# Patient Record
Sex: Male | Born: 1950
Health system: Southern US, Community
[De-identification: ages and names within clinical notes are randomized; demographics above are authoritative.]

## PROBLEM LIST (undated history)

## (undated) DIAGNOSIS — R55 Syncope and collapse: Secondary | ICD-10-CM

## (undated) DIAGNOSIS — T7840XA Allergy, unspecified, initial encounter: Secondary | ICD-10-CM

## (undated) DIAGNOSIS — E785 Hyperlipidemia, unspecified: Secondary | ICD-10-CM

## (undated) HISTORY — PX: OTHER SURGICAL HISTORY: SHX169

## (undated) HISTORY — PX: POLYPECTOMY: SHX149

## (undated) HISTORY — DX: Allergy, unspecified, initial encounter: T78.40XA

## (undated) HISTORY — DX: Hyperlipidemia, unspecified: E78.5

## (undated) HISTORY — DX: Syncope and collapse: R55

---

## 2002-05-30 ENCOUNTER — Ambulatory Visit (HOSPITAL_COMMUNITY): Admission: RE | Admit: 2002-05-30 | Discharge: 2002-05-30 | Payer: Self-pay | Admitting: Gastroenterology

## 2002-06-03 ENCOUNTER — Encounter: Payer: Self-pay | Admitting: *Deleted

## 2002-06-03 ENCOUNTER — Ambulatory Visit (HOSPITAL_COMMUNITY): Admission: RE | Admit: 2002-06-03 | Discharge: 2002-06-03 | Payer: Self-pay | Admitting: *Deleted

## 2004-12-12 ENCOUNTER — Encounter: Admission: RE | Admit: 2004-12-12 | Discharge: 2004-12-12 | Payer: Self-pay | Admitting: *Deleted

## 2005-12-22 ENCOUNTER — Ambulatory Visit: Payer: Self-pay | Admitting: Internal Medicine

## 2005-12-29 ENCOUNTER — Ambulatory Visit: Payer: Self-pay | Admitting: Internal Medicine

## 2006-01-05 ENCOUNTER — Encounter: Payer: Self-pay | Admitting: Cardiology

## 2006-01-05 ENCOUNTER — Ambulatory Visit: Payer: Self-pay

## 2006-01-30 ENCOUNTER — Ambulatory Visit: Payer: Self-pay | Admitting: Internal Medicine

## 2006-02-10 ENCOUNTER — Ambulatory Visit: Payer: Self-pay | Admitting: Internal Medicine

## 2008-03-07 ENCOUNTER — Telehealth: Payer: Self-pay | Admitting: Internal Medicine

## 2008-08-28 ENCOUNTER — Encounter: Admission: RE | Admit: 2008-08-28 | Discharge: 2008-10-19 | Payer: Self-pay | Admitting: Orthopedic Surgery

## 2009-12-31 ENCOUNTER — Encounter (INDEPENDENT_AMBULATORY_CARE_PROVIDER_SITE_OTHER): Payer: Self-pay | Admitting: *Deleted

## 2010-01-30 ENCOUNTER — Encounter (INDEPENDENT_AMBULATORY_CARE_PROVIDER_SITE_OTHER): Payer: Self-pay | Admitting: *Deleted

## 2010-02-04 ENCOUNTER — Ambulatory Visit: Payer: Self-pay | Admitting: Gastroenterology

## 2010-02-27 ENCOUNTER — Encounter: Payer: Self-pay | Admitting: Internal Medicine

## 2010-03-27 ENCOUNTER — Ambulatory Visit: Payer: Self-pay | Admitting: Gastroenterology

## 2010-03-31 ENCOUNTER — Encounter: Payer: Self-pay | Admitting: Gastroenterology

## 2010-07-09 NOTE — Letter (Signed)
Summary: Prostate Cancer Screening/[facility]  Prostate Cancer Screening/[facility]   Imported By: Lanelle Bal 03/19/2010 09:58:01  _____________________________________________________________________  External Attachment:    Type:   Image     Comment:   External Document

## 2010-07-09 NOTE — Procedures (Signed)
Summary: Colonoscopy  Patient: Mike Hall Note: All result statuses are Final unless otherwise noted.  Tests: (1) Colonoscopy (COL)   COL Colonoscopy           DONE     [facility]     [address]     [place], [place]  [address]           COLONOSCOPY PROCEDURE REPORT     PATIENT:  [name], [name]  MR#:  [id number]     BIRTHDATE:  1950/07/14, 59 yrs. old  GENDER:  male     ENDOSCOPIST:  Judie Petit T. Russella Dar, MD, [facility]     Referred by:  Rudi Heap, M.D.     PROCEDURE DATE:  03/27/2010     PROCEDURE:  Colonoscopy with snare polypectomy     ASA CLASS:  Class I     INDICATIONS:  1) Routine Risk Screening     MEDICATIONS:   Fentanyl 75 mcg IV, Versed 10 mg IV     DESCRIPTION OF PROCEDURE:   After the risks benefits and     alternatives of the procedure were thoroughly explained, informed     consent was obtained.  Digital rectal exam was performed and     revealed no abnormalities.   The LB [id number]-[id number] endoscope     was introduced through the anus and advanced to the cecum, which     was identified by both the appendix and ileocecal valve, without     limitations.  The quality of the prep was good, using MoviPrep.     The instrument was then slowly withdrawn as the colon was fully     examined.     <<PROCEDUREIMAGES>>     FINDINGS:  A sessile polyp was found in the cecum. It was 5 mm in     size. Polyp was snared without cautery. Retrieval was successful.     A normal appearing ileocecal valve, and appendiceal orifice were     identified. The ascending, hepatic flexure, transverse, splenic     flexure, descending, sigmoid colon, and rectum appeared     unremarkable. Retroflexed views in the rectum revealed internal     hemorrhoids, small. The time to cecum =  2.25  minutes. The scope     was then withdrawn (time =  9.25  min) from the patient and the     procedure completed.           COMPLICATIONS:  None           ENDOSCOPIC IMPRESSION:     1) 5 mm sessile polyp in  the cecum     2) Internal hemorrhoids           RECOMMENDATIONS:     1) Await pathology results     2) If the polyp removed today is adenomatous (pre-cancerous),     repeat colonoscopy in 5 years. Otherwise follow colorectal cancer     screening guidelines for "routine risk" patients with colonoscopy     in 10 years.           Venita Lick. Russella Dar, MD, [facility]           n.     eSIGNED:   Venita Lick. Stark at 03/27/2010 11:30 AM           Delphia Grates, [id number]  Note: An exclamation mark (!) indicates a result that was not dispersed into the flowsheet. Document Creation Date: 03/27/2010 11:31 AM _______________________________________________________________________  (1)  Order result status: Final Collection or observation date-time: 03/27/2010 11:26 Requested date-time:  Receipt date-time:  Reported date-time:  Referring Physician:   Ordering Physician: Claudette Head [phone]) Specimen Source:  Source: Launa Grill Order Number: [id number] Lab site:   Appended Document: Colonoscopy     Procedures Next Due Date:    Colonoscopy: 03/2015

## 2010-07-09 NOTE — Letter (Signed)
Summary: Moviprep Instructions  [facility]  [address]   [place], [place] [address]   Phone: [phone]  Fax: [phone]       Mike Hall    1951-01-31    MRN: [id number]        Procedure Day /Date: Wednesday, 03/27/10     Arrival Time: 09:30AM     Procedure Time: 10:30AM     Location of Procedure:                    x   [facility] (4th Floor)   PREPARATION FOR COLONOSCOPY WITH MOVIPREP   Starting 5 days prior to your procedure 03/22/2010  do not eat nuts, seeds, popcorn, corn, beans, peas,  salads, or any raw vegetables.  Do not take any fiber supplements (e.g. Metamucil, Citrucel, and Benefiber).  THE DAY BEFORE YOUR PROCEDURE         DATE: 03-26-10    DAY: Tues.  1.  Drink clear liquids the entire day-NO SOLID FOOD  2.  Do not drink anything colored red or purple.  Avoid juices with pulp.  No orange juice.  3.  Drink at least 64 oz. (8 glasses) of fluid/clear liquids during the day to prevent dehydration and help the prep work efficiently.  CLEAR LIQUIDS INCLUDE: Water Jello Ice Popsicles Tea (sugar ok, no milk/cream) Powdered fruit flavored drinks Coffee (sugar ok, no milk/cream) Gatorade Juice: apple, white grape, white cranberry  Lemonade Clear bullion, consomm, broth Carbonated beverages (any kind) Strained chicken noodle soup Hard Candy                             4.  In the morning, mix first dose of MoviPrep solution:    Empty 1 Pouch A and 1 Pouch B into the disposable container    Add lukewarm drinking water to the top line of the container. Mix to dissolve    Refrigerate (mixed solution should be used within 24 hrs)  5.  Begin drinking the prep at 5:00 p.m. The MoviPrep container is divided by 4 marks.   Every 15 minutes drink the solution down to the next mark (approximately 8 oz) until the full liter is complete.   6.  Follow completed prep with 16 oz of clear liquid of your choice (Nothing red or purple).   Continue to drink clear liquids until bedtime.  7.  Before going to bed, mix second dose of MoviPrep solution:    Empty 1 Pouch A and 1 Pouch B into the disposable container    Add lukewarm drinking water to the top line of the container. Mix to dissolve    Refrigerate  THE DAY OF YOUR PROCEDURE      DATE:  03-27-10  DAY: Wed.  Beginning at 5:00 a.m. (5 hours before procedure):         1. Every 15 minutes, drink the solution down to the next mark (approx 8 oz) until the full liter is complete.  2. Follow completed prep with 16 oz. of clear liquid of your choice.    3. You may drink clear liquids until  8:30 a.m.  (2 HOURS BEFORE PROCEDURE).   MEDICATION INSTRUCTIONS  Unless otherwise instructed, you should take regular prescription medications with a small sip of water   as early as possible the morning of your procedure.          OTHER INSTRUCTIONS  You will  need a responsible adult at least 58 years of age to accompany you and drive you home.   This person must remain in the waiting room during your procedure.  Wear loose fitting clothing that is easily removed.  Leave jewelry and other valuables at home.  However, you may wish to bring a book to read or  an iPod/MP3 player to listen to music as you wait for your procedure to start.  Remove all body piercing jewelry and leave at home.  Total time from sign-in until discharge is approximately 2-3 hours.  You should go home directly after your procedure and rest.  You can resume normal activities the  day after your procedure.  The day of your procedure you should not:   Drive   Make legal decisions   Operate machinery   Drink alcohol   Return to work  You will receive specific instructions about eating, activities and medications before you leave.    The above instructions have been reviewed and explained to me by   _______________________    I fully understand and can verbalize these instructions  _____________________________ Date _________

## 2010-07-09 NOTE — Letter (Signed)
Summary: Previsit letter  [facility]  [address] [place], [place] [address]   Phone: [phone]  Fax: [phone]       12/31/2009 MRN: [id number]  Mike Hall [address] [place], [place]  [address]  Dear [name],  Welcome to the Gastroenterology Division at [facility].    You are scheduled to see a nurse for your pre-procedure visit on 01-31-10 at 8:00A.M. on the 3rd floor at [facility], [address]  We ask that you try to arrive at our office 15 minutes prior to your appointment time to allow for check-in.  Your nurse visit will consist of discussing your medical and surgical history, your immediate family medical history, and your medications.    Please bring a complete list of all your medications or, if you prefer, bring the medication bottles and we will list them.  We will need to be aware of both prescribed and over the counter drugs.  We will need to know exact dosage information as well.  If you are on blood thinners (Coumadin, Plavix, Aggrenox, Ticlid, etc.) please call our office today/prior to your appointment, as we need to consult with your physician about holding your medication.   Please be prepared to read and sign documents such as consent forms, a financial agreement, and acknowledgement forms.  If necessary, and with your consent, a friend or relative is welcome to sit-in on the nurse visit with you.  Please bring your insurance card so that we may make a copy of it.  If your insurance requires a referral to see a specialist, please bring your referral form from your primary care physician.  No co-pay is required for this nurse visit.     If you cannot keep your appointment, please call [phone] to cancel or reschedule prior to your appointment date.  This allows [place] the opportunity to schedule an appointment for another patient in need of care.    Thank you for choosing [facility] for your medical needs.  We  appreciate the opportunity to care for you.  Please visit [place] at our website  to learn more about our practice.                     Sincerely.                                                                                                                   The Gastroenterology Division

## 2010-07-09 NOTE — Miscellaneous (Signed)
Summary: [facility]  Clinical Lists Changes  Medications: Added new medication of MOVIPREP 100 GM  SOLR (PEG-KCL-NACL-NASULF-NA ASC-C) As per prep instructions. - Signed Rx of MOVIPREP 100 GM  SOLR (PEG-KCL-NACL-NASULF-NA ASC-C) As per prep instructions.;  #1 x 0;  Signed;  Entered by: Durwin Glaze RN;  Authorized by: Meryl Dare MD [facility];  Method used: Electronically to [id number]*, [address] [place], [place], [place]  [address], Ph: [phone] or [phone], Fax: [phone] Observations: Added new observation of NKA: T (02/04/2010 15:03)    Prescriptions: MOVIPREP 100 GM  SOLR (PEG-KCL-NACL-NASULF-NA ASC-C) As per prep instructions.  #1 x 0   Entered by:   Durwin Glaze RN   Authorized by:   Meryl Dare MD [facility]   Signed by:   Durwin Glaze RN on 02/04/2010   Method used:   Electronically to        [facility] [phone]* (retail)       [address] [place], [place]  [address]       Ph: [phone] or [phone]       Fax: [phone]   RxID:   [id number]

## 2010-07-09 NOTE — Letter (Signed)
Summary: Patient Notice- Polyp Results  [facility]  [address] [place], [place] [address]   Phone: [phone]  Fax: [phone]        March 31, 2010 MRN: [id number]    Mike Hall [address] [place], [place]  [address]    Dear Mr. Angert,  I am pleased to inform you that the colon polyp(s) removed during your recent colonoscopy was (were) found to be benign (no cancer detected) upon pathologic examination.  I recommend you have a repeat colonoscopy examination in 5 years to look for recurrent polyps, as having colon polyps increases your risk for having recurrent polyps or even colon cancer in the future.  Should you develop new or worsening symptoms of abdominal pain, bowel habit changes or bleeding from the rectum or bowels, please schedule an evaluation with either your primary care physician or with me.  Continue treatment plan as outlined the day of your exam.  Please call us if you are having persistent problems or have questions about your condition that have not been fully answered at this time.  Sincerely,  Meryl Dare MD [facility]  This letter has been electronically signed by your physician.  Appended Document: Patient Notice- Polyp Results letter mailed

## 2010-08-01 ENCOUNTER — Other Ambulatory Visit: Payer: Self-pay | Admitting: Dermatology

## 2010-10-25 NOTE — Op Note (Signed)
   NAME:  Mike Hall, Mike Hall                            ACCOUNT NO.:  [id number]   MEDICAL RECORD NO.:  [id number]                   PATIENT TYPE:  AMB   LOCATION:  ENDO                                 FACILITY:  [facility]   PHYSICIAN:  Georgiana Spinner, M.D.                 DATE OF BIRTH:  December 02, 1950   DATE OF PROCEDURE:  05/30/2002  DATE OF DISCHARGE:                                 OPERATIVE REPORT   PROCEDURE:  Upper endoscopy.   ENDOSCOPIST:  Georgiana Spinner, M.D.   INDICATIONS:  Reflux.  Hemoccult positivity.   ANESTHESIA:  Demerol 50 mg, Versed 6 mg.   DESCRIPTION OF PROCEDURE:  With the patient mildly sedated in the left  lateral decubitus position, the Olympus videoscopic endoscope was inserted  into the mouth, passed under direct vision through the esophagus which  appeared normal into the stomach. The fundus, body, antrum, duodenal bulb,  second portion of the duodenum appeared normal  From this point, the  endoscope was slowly withdrawn taking  circumferential views of duodenal  mucosa until the endoscope was pulled back into the stomach, placed in  retroflexion to view the stomach from below, and we were able to visualize  the esophagus on retroflexed view.  The endoscope was then straightened and  withdrawn taking circumferential views of the remaining gastric and  esophageal mucosa.  The patient's vital signs and pulse oximeter remained  stable.  The patient tolerated the procedure well without apparent  complications.   FINDINGS:  Hiatal hernia.  Otherwise unremarkable exam.   PLAN:  Proceed to colonoscopy.                                               Georgiana Spinner, M.D.    GMO/MEDQ  D:  05/30/2002  T:  05/30/2002  Job:  [id number]

## 2010-10-25 NOTE — Op Note (Signed)
   NAME:  BROADUS, COSTILLA                            ACCOUNT NO.:  [id number]   MEDICAL RECORD NO.:  [id number]                   PATIENT TYPE:  AMB   LOCATION:  ENDO                                 [facility]   PHYSICIAN:  Georgiana Spinner, M.D.                 DATE OF BIRTH:  03-02-1951   DATE OF PROCEDURE:  05/30/2002  DATE OF DISCHARGE:                                 OPERATIVE REPORT   PROCEDURE:  Colonoscopy.   ENDOSCOPIST:  Georgiana Spinner, M.D.   INDICATIONS:  Hemoccult positivity.   ANESTHESIA:  Demerol 10 mg, Versed 2 mg.   DESCRIPTION OF PROCEDURE:  With the patient mildly sedated in the left  lateral decubitus position, the rectal exam was performed which was  unremarkable.  Subsequently, the Olympus videoscopic colonoscope was  inserted in the rectum  and passed under direct vision to the cecum,  identified by Crow's foot of the cecum and the ileocecal valve, both of  which were photographed.  After exploring the cecum, the colonoscope was  slowly withdrawn taking circumferential views of the entire colonic mucosa  stopping only in the rectum which appeared on direct and retroflexed view.  The endoscope was straightened and withdrawn.  The patient's vital signs and  pulse oximeter remained stable.  The patient tolerated the procedure well  without apparent complications.   FINDINGS:  Negative examination.   PLAN:  Ask the patient to follow up with me after CT scan.                                               Georgiana Spinner, M.D.    GMO/MEDQ  D:  05/30/2002  T:  05/30/2002  Job:  [id number]

## 2010-10-25 NOTE — Assessment & Plan Note (Signed)
[facility]                             PRIMARY CARE OFFICE NOTE   SENECA, HOBACK                         MRN:          [id number]  DATE:12/29/2005                            DOB:          1950-12-24    CHIEF COMPLAINT:  New patient practice/routine physical.   HISTORY OF PRESENT ILLNESS:  The patient is a 60 year old white male here to  establish primary care. The patient has been followed previously by Dr. Vernon Prey at [facility]. The patient has been fairly  healthy for most of his life. Denies any major illnesses or  hospitalizations. The patient denies any history of heart disease or type 2  diabetes, high cholesterol or hypertension. The patient did have upper  endoscopy and colonoscopy with Dr. Sabino Gasser secondary to positive  Hemoccult. The patient was found to have a hiatal hernia; otherwise,  unremarkable exam and colonoscopy performed in December 2003 was a negative  examination.   The patient does not take any prescription medications. Takes over-the-  counter FiberCon daily and also Colon Cleanse. He does note some issues with  constipation.   ALLERGIES:  None noted.   SOCIAL HISTORY:  The patient is married for 25 years. The patient has one  daughter. 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   FAMILY HISTORY:  Father deceased at age 64 secondary to coronary artery  disease/myocardial infarction. Mother deceased at age 35 secondary to  complications of ovarian cancer. The patient also had a sister noted to have  nonsmall-cell cancer of the lung.   HABITS:  He does not drink or smoke. He does not use any recreational drugs.   REVIEW OF SYSTEMS:  The patient does not exercise on a regular basis, but  denies any exercise intolerance. The patient states he recently returned  from a trip to the beach and was able to swim and hold his breath without  any difficulty. The  patient denies any heartburn, nausea or vomiting.  Constipation is noted above. No diarrhea. No dysuria, frequency or urgency.  All other systems negative. Please also note that he does not complain of  weak urinary stream or incomplete bladder emptying.   PHYSICAL EXAMINATION:  VITALS:  Height 5 feet 10-1/2 inches, weight 196  pounds, temperature 99, pulse 69, BP 113/73.  GENERAL:  The patient is a very pleasant, well-developed, well-nourished 74-  year-old white male in no apparent distress.  HEENT:  Normocephalic, atraumatic. Pupils are equal and react to light  bilaterally. Extraocular muscles are intact. Patient was anicteric.  Conjunctivae was within normal limits. External auditory canals and tympanic  membranes were clear bilaterally. Hearing was grossly normal. Oropharyngeal  exam was unremarkable.  NECK:  Supple, no adenopathy, carotid bruit or thyromegaly.  RESPIRATORY:  Normal respiratory effort.  CHEST:  Clear to auscultation bilaterally. No rhonchi, rales or wheezing.  CARDIOVASCULAR:  Regular rate and rhythm. No significant murmurs, rubs or  gallops appreciated.  ABDOMEN:  Soft, nontender. Positive bowel sounds, no organomegaly.  GENITORECTAL:  Digital rectal exam of  the prostate was performed. Patient  had normal rectal tone. Prostate gland was mildly enlarged but no firmness  or nodules are noted.  SKIN:  Warm and dry.  NEUROLOGIC:  Cranial nerves II-XII was grossly intact. He was not focal.   LABORATORY DATA:  The patient had routine screening labs with the following  results. CBC showed H&H 13.6 and 40.2, WBC and platelets within normal  limits. Comprehensive metabolic profile notable for normal glucose 94, BUN  14, creatinine 0.9, electrolytes are within normal limits. Total bilirubin  was minimally elevated at 1.3. Liver transaminase unremarkable. Cholesterol  panel:  Total cholesterol 227, HDL 36, triglycerides 61, direct LDL 171.5.  TSH 0.71. PSA is noted to be  0.27.   IMPRESSION:  Routine physical in an otherwise healthy 60 year old white male  with some elevated cholesterol readings.   RECOMMENDATIONS:  The patient would like to avoid using statins if possible,  and we discussed the dietary changes. The patient was agreeable to trying  Zetia, and will be started on 10 mg once a day. His lipids will be rechecked  in approximately one month's time. Did go over a low saturated fat diet.   The patient also had an EKG performed in the office that is a screening and  that showed normal sinus rhythm at 59 beats per minute. He did have Q-waves  in the inferolateral leads notable in lead III, aVF, and also leads V5 and  V6. Unclear at this time whether these Q waves reflect previous heart  injury. This EKG was faxed to Dr. Charlies Constable who reviewed and recommended  followup with 2D echocardiogram due to lack of clinical history. The  likelihood of ischemic injury in the past is unlikely.   FOLLOWUP:  Followup time is approximately five weeks.                                   Barbette Hair. Artist Pais, DO   RDY/MedQ  DD:  12/30/2005  DT:  12/30/2005  Job #:  [id number]

## 2012-10-26 ENCOUNTER — Telehealth: Payer: Self-pay | Admitting: Nurse Practitioner

## 2012-10-26 NOTE — Telephone Encounter (Signed)
error 

## 2012-11-19 ENCOUNTER — Telehealth: Payer: Self-pay | Admitting: Nurse Practitioner

## 2012-11-22 ENCOUNTER — Other Ambulatory Visit: Payer: Self-pay | Admitting: *Deleted

## 2012-11-22 MED ORDER — PRAVASTATIN SODIUM 40 MG PO TABS: 40.0000 mg | ORAL_TABLET | Freq: Every day | ORAL | Status: DC

## 2012-11-22 NOTE — Telephone Encounter (Signed)
Done 11/22/12 

## 2012-12-20 ENCOUNTER — Encounter: Payer: Self-pay | Admitting: Nurse Practitioner

## 2012-12-20 ENCOUNTER — Ambulatory Visit (INDEPENDENT_AMBULATORY_CARE_PROVIDER_SITE_OTHER): Payer: 59 | Admitting: Nurse Practitioner

## 2012-12-20 VITALS — BP 113/75 | HR 69 | Temp 98.2°F | Ht 69.0 in | Wt 189.0 lb

## 2012-12-20 DIAGNOSIS — E785 Hyperlipidemia, unspecified: Secondary | ICD-10-CM

## 2012-12-20 NOTE — Patient Instructions (Signed)

## 2012-12-20 NOTE — Progress Notes (Signed)
  Subjective:    Patient ID: Mike Hall, male    DOB: 09-21-1950, 62 y.o.   MRN: [id number]  Hyperlipidemia This is a chronic problem. The current episode started more than 1 year ago. The problem is uncontrolled. Recent lipid tests were reviewed and are high. There are no known factors aggravating his hyperlipidemia. Pertinent negatives include no focal sensory loss, focal weakness, leg pain, myalgias or shortness of breath. Current antihyperlipidemic treatment includes statins (patient started on pravachol at last visit.). The current treatment provides moderate improvement of lipids. There are no compliance problems.       Review of Systems  Respiratory: Negative for shortness of breath.   Musculoskeletal: Negative for myalgias.  Neurological: Negative for focal weakness.  All other systems reviewed and are negative.       Objective:   Physical Exam  Constitutional: He is oriented to person, place, and time. He appears well-developed and well-nourished.  HENT:  Head: Normocephalic.  Right Ear: External ear normal.  Left Ear: External ear normal.  Nose: Nose normal.  Mouth/Throat: Oropharynx is clear and moist.  Eyes: EOM are normal. Pupils are equal, round, and reactive to light.  Neck: Normal range of motion. Neck supple. No thyromegaly present.  Cardiovascular: Normal rate, regular rhythm, normal heart sounds and intact distal pulses.   No murmur heard. Pulmonary/Chest: Effort normal and breath sounds normal. He has no wheezes. He has no rales.  Abdominal: Soft. Bowel sounds are normal.  Musculoskeletal: Normal range of motion.  Neurological: He is alert and oriented to person, place, and time.  Skin: Skin is warm and dry.  Psychiatric: He has a normal mood and affect. His behavior is normal. Judgment and thought content normal.     BP 113/75  Pulse 69  Temp(Src) 98.2 F (36.8 C) (Oral)  Ht 5\' 9"  (1.753 m)  Wt 189 lb (85.73 kg)  BMI 27.9 kg/m2      Assessment &  Plan:  1. Hyperlipidemia Low fat diet and exercise - COMPLETE METABOLIC PANEL WITH GFR - NMR Lipoprofile with Lipids  Mary-Margaret Daphine Deutscher, FNP

## 2012-12-21 LAB — NMR LIPOPROFILE WITH LIPIDS
Cholesterol, Total: 189 mg/dL (ref <200)
HDL Particle Number: 32.2 umol/L (ref >=30.5)
LDL Size: 20.4 nm — ABNORMAL LOW (ref >20.5)
LP-IR Score: 74 — ABNORMAL HIGH (ref <=45)
Large HDL-P: 2.7 umol/L — ABNORMAL LOW (ref >=4.8)
Large VLDL-P: 2.8 nmol/L — ABNORMAL HIGH (ref <=2.7)
Small LDL Particle Number: 1229 nmol/L — ABNORMAL HIGH (ref <=527)

## 2012-12-21 LAB — COMPLETE METABOLIC PANEL WITH GFR
ALT: 24 U/L (ref 0–53)
AST: 24 U/L (ref 0–37)
Albumin: 4.7 g/dL (ref 3.5–5.2)
CO2: 28 mEq/L (ref 19–32)
Calcium: 9.4 mg/dL (ref 8.4–10.5)
Chloride: 104 mEq/L (ref 96–112)
GFR, Est African American: >89 mL/min
Potassium: 4.6 mEq/L (ref 3.5–5.3)
Sodium: 140 mEq/L (ref 135–145)
Total Protein: 6.8 g/dL (ref 6.0–8.3)

## 2012-12-23 ENCOUNTER — Telehealth: Payer: Self-pay | Admitting: Nurse Practitioner

## 2012-12-23 MED ORDER — ATORVASTATIN CALCIUM 40 MG PO TABS: 40.0000 mg | ORAL_TABLET | Freq: Every day | ORAL | Status: DC

## 2012-12-23 NOTE — Telephone Encounter (Signed)
Mmm, pt is willing to try the lipitor-(generic) that was reccommended. Please send in to [facility] and have nurse call pt when its done.

## 2012-12-30 ENCOUNTER — Telehealth: Payer: Self-pay | Admitting: Nurse Practitioner

## 2012-12-31 NOTE — Telephone Encounter (Signed)
Spoke with patient's wife.

## 2013-02-14 ENCOUNTER — Telehealth: Payer: Self-pay | Admitting: Nurse Practitioner

## 2013-02-14 DIAGNOSIS — E785 Hyperlipidemia, unspecified: Secondary | ICD-10-CM

## 2013-02-15 MED ORDER — ATORVASTATIN CALCIUM 40 MG PO TABS: 40.0000 mg | ORAL_TABLET | Freq: Every day | ORAL | Status: DC

## 2013-02-15 NOTE — Telephone Encounter (Signed)
rx sent to pharmacy

## 2013-02-17 ENCOUNTER — Telehealth: Payer: Self-pay | Admitting: Nurse Practitioner

## 2013-02-17 ENCOUNTER — Other Ambulatory Visit: Payer: Self-pay | Admitting: Nurse Practitioner

## 2013-05-16 ENCOUNTER — Ambulatory Visit (INDEPENDENT_AMBULATORY_CARE_PROVIDER_SITE_OTHER): Payer: 59 | Admitting: Nurse Practitioner

## 2013-05-16 ENCOUNTER — Encounter: Payer: Self-pay | Admitting: Nurse Practitioner

## 2013-05-16 VITALS — BP 127/75 | HR 81 | Temp 97.1°F | Ht 69.5 in | Wt 191.0 lb

## 2013-05-16 DIAGNOSIS — Z0000 Encounter for general adult medical examination without abnormal findings: Secondary | ICD-10-CM

## 2013-05-16 DIAGNOSIS — Z23 Encounter for immunization: Secondary | ICD-10-CM

## 2013-05-16 DIAGNOSIS — E785 Hyperlipidemia, unspecified: Secondary | ICD-10-CM

## 2013-05-16 DIAGNOSIS — Z125 Encounter for screening for malignant neoplasm of prostate: Secondary | ICD-10-CM

## 2013-05-16 LAB — POCT CBC
Granulocyte percent: 57 %G (ref 37–80)
Lymph, poc: 1.4 (ref 0.6–3.4)
MPV: 8.5 fL (ref 0–99.8)
POC LYMPH PERCENT: 34.5 %L (ref 10–50)
Platelet Count, POC: 135 10*3/uL — AB (ref 142–424)
RBC: 4.4 M/uL — AB (ref 4.69–6.13)

## 2013-05-16 MED ORDER — PRAVASTATIN SODIUM 40 MG PO TABS: 40.0000 mg | ORAL_TABLET | Freq: Every day | ORAL | Status: DC

## 2013-05-16 NOTE — Patient Instructions (Signed)
Health Maintenance, Males A healthy lifestyle and preventative care can promote health and wellness.  Maintain regular health, dental, and eye exams.  Eat a healthy diet. Foods like vegetables, fruits, whole grains, low-fat dairy products, and lean protein foods contain the nutrients you need without too many calories. Decrease your intake of foods high in solid fats, added sugars, and salt. Get information about a proper diet from your caregiver, if necessary.  Regular physical exercise is one of the most important things you can do for your health. Most adults should get at least 150 minutes of moderate-intensity exercise (any activity that increases your heart rate and causes you to sweat) each week. In addition, most adults need muscle-strengthening exercises on 2 or more days a week.   Maintain a healthy weight. The body mass index (BMI) is a screening tool to identify possible weight problems. It provides an estimate of body fat based on height and weight. Your caregiver can help determine your BMI, and can help you achieve or maintain a healthy weight. For adults 20 years and older:  A BMI below 18.5 is considered underweight.  A BMI of 18.5 to 24.9 is normal.  A BMI of 25 to 29.9 is considered overweight.  A BMI of 30 and above is considered obese.  Maintain normal blood lipids and cholesterol by exercising and minimizing your intake of saturated fat. Eat a balanced diet with plenty of fruits and vegetables. Blood tests for lipids and cholesterol should begin at age 20 and be repeated every 5 years. If your lipid or cholesterol levels are high, you are over 50, or you are a high risk for heart disease, you may need your cholesterol levels checked more frequently.Ongoing high lipid and cholesterol levels should be treated with medicines, if diet and exercise are not effective.  If you smoke, find out from your caregiver how to quit. If you do not use tobacco, do not start.  Lung  cancer screening is recommended for adults aged 55 80 years who are at high risk for developing lung cancer because of a history of smoking. Yearly low-dose computed tomography (CT) is recommended for people who have at least a 30-pack-year history of smoking and are a current smoker or have quit within the past 15 years. A pack year of smoking is smoking an average of 1 pack of cigarettes a day for 1 year (for example: 1 pack a day for 30 years or 2 packs a day for 15 years). Yearly screening should continue until the smoker has stopped smoking for at least 15 years. Yearly screening should also be stopped for people who develop a health problem that would prevent them from having lung cancer treatment.  If you choose to drink alcohol, do not exceed 2 drinks per day. One drink is considered to be 12 ounces (355 mL) of beer, 5 ounces (148 mL) of wine, or 1.5 ounces (44 mL) of liquor.  Avoid use of street drugs. Do not share needles with anyone. Ask for help if you need support or instructions about stopping the use of drugs.  High blood pressure causes heart disease and increases the risk of stroke. Blood pressure should be checked at least every 1 to 2 years. Ongoing high blood pressure should be treated with medicines if weight loss and exercise are not effective.  If you are 45 to 60 years old, ask your caregiver if you should take aspirin to prevent heart disease.  Diabetes screening involves taking a blood   sample to check your fasting blood sugar level. This should be done once every 3 years, after age 45, if you are within normal weight and without risk factors for diabetes. Testing should be considered at a younger age or be carried out more frequently if you are overweight and have at least 1 risk factor for diabetes.  Colorectal cancer can be detected and often prevented. Most routine colorectal cancer screening begins at the age of 50 and continues through age 75. However, your caregiver may  recommend screening at an earlier age if you have risk factors for colon cancer. On a yearly basis, your caregiver may provide home test kits to check for hidden blood in the stool. Use of a small camera at the end of a tube, to directly examine the colon (sigmoidoscopy or colonoscopy), can detect the earliest forms of colorectal cancer. Talk to your caregiver about this at age 50, when routine screening begins. Direct examination of the colon should be repeated every 5 to 10 years through age 75, unless early forms of pre-cancerous polyps or small growths are found.  Hepatitis C blood testing is recommended for all people born from 1945 through 1965 and any individual with known risks for hepatitis C.  Healthy men should no longer receive prostate-specific antigen (PSA) blood tests as part of routine cancer screening. Consult with your caregiver about prostate cancer screening.  Testicular cancer screening is not recommended for adolescents or adult males who have no symptoms. Screening includes self-exam, caregiver exam, and other screening tests. Consult with your caregiver about any symptoms you have or any concerns you have about testicular cancer.  Practice safe sex. Use condoms and avoid high-risk sexual practices to reduce the spread of sexually transmitted infections (STIs).  Use sunscreen. Apply sunscreen liberally and repeatedly throughout the day. You should seek shade when your shadow is shorter than you. Protect yourself by wearing long sleeves, pants, a wide-brimmed hat, and sunglasses year round, whenever you are outdoors.  Notify your caregiver of new moles or changes in moles, especially if there is a change in shape or color. Also notify your caregiver if a mole is larger than the size of a pencil eraser.  A one-time screening for abdominal aortic aneurysm (AAA) and surgical repair of large AAAs by sound wave imaging (ultrasonography) is recommended for ages 65 to 75 years who are  current or former smokers.  Stay current with your immunizations. Document Released: 11/22/2007 Document Revised: 09/20/2012 Document Reviewed: 10/21/2010 ExitCare Patient Information 2014 [employer].  

## 2013-05-16 NOTE — Progress Notes (Signed)
  Subjective:    Patient ID: Mike Hall, male    DOB: [birth year], 62 y.o.   MRN: [id number]    Patient here today for CPE and follow up of hyperlipidemia- He is doing well without complaints  Hyperlipidemia This is a chronic problem. The current episode started more than 1 year ago. The problem is uncontrolled. Recent lipid tests were reviewed and are high. There are no known factors aggravating his hyperlipidemia. Pertinent negatives include no focal sensory loss, focal weakness, leg pain, myalgias or shortness of breath. Current antihyperlipidemic treatment includes statins (patient started on pravachol at last visit.). The current treatment provides moderate improvement of lipids. There are no compliance problems.       Review of Systems  Respiratory: Negative for shortness of breath.   Musculoskeletal: Negative for myalgias.  Neurological: Negative for focal weakness.  All other systems reviewed and are negative.       Objective:   Physical Exam  Constitutional: He is oriented to person, place, and time. He appears well-developed and well-nourished.  HENT:  Head: Normocephalic.  Right Ear: External ear normal.  Left Ear: External ear normal.  Nose: Nose normal.  Mouth/Throat: Oropharynx is clear and moist.  Eyes: EOM are normal. Pupils are equal, round, and reactive to light.  Neck: Normal range of motion. Neck supple. No thyromegaly present.  Cardiovascular: Normal rate, regular rhythm, normal heart sounds and intact distal pulses.   No murmur heard. Pulmonary/Chest: Effort normal and breath sounds normal. He has no wheezes. He has no rales.  Abdominal: Soft. Bowel sounds are normal.  Genitourinary:  Refuses prostate exam   Musculoskeletal: Normal range of motion.  Neurological: He is alert and oriented to person, place, and time.  Skin: Skin is warm and dry.  Psychiatric: He has a normal mood and affect. His behavior is normal. Judgment and thought content normal.      BP 127/75  Pulse 81  Temp(Src) 97.1 F (36.2 C) (Oral)  Ht 5' 9.5" (1.765 m)  Wt 191 lb (86.637 kg)  BMI 27.81 kg/m2       Assessment & Plan:   1. Hyperlipidemia LDL goal < 100   2. Annual physical exam   3. Prostate cancer screening    Orders Placed This Encounter  Procedures  . CMP14+EGFR  . NMR, lipoprofile  . PSA, total and free  . POCT CBC   Meds ordered this encounter  Medications  . DISCONTD: pravastatin (PRAVACHOL) 40 MG tablet    Sig: Take 40 mg by mouth daily.  . magnesium gluconate (MAGONATE) 500 MG tablet    Sig: Take 500 mg by mouth 2 (two) times daily.  . pravastatin (PRAVACHOL) 40 MG tablet    Sig: Take 1 tablet (40 mg total) by mouth daily.    Dispense:  90 tablet    Refill:  1    Order Specific Question:  Supervising Provider    Answer:  Deborra Medina    Continue all meds Labs pending Diet and exercise encouraged Health maintenance reviewed Follow up in 6 months Hemoccult cards given  Mike Daphine Deutscher, FNP

## 2013-05-16 NOTE — Addendum Note (Signed)
Addended by: Bernita Buffy on: 05/16/2013 03:34 PM   Modules accepted: Orders

## 2013-05-17 LAB — NMR, LIPOPROFILE
HDL Cholesterol by NMR: 37 mg/dL — ABNORMAL LOW (ref >=40)
HDL Particle Number: 25.6 umol/L — ABNORMAL LOW (ref >=30.5)
LDL Size: 20.8 nm (ref >20.5)
Small LDL Particle Number: 843 nmol/L — ABNORMAL HIGH (ref <=527)
Triglycerides by NMR: 278 mg/dL — ABNORMAL HIGH (ref <150)

## 2013-05-17 LAB — CMP14+EGFR
AST: 50 IU/L — ABNORMAL HIGH (ref 0–40)
Albumin/Globulin Ratio: 1.9 (ref 1.1–2.5)
BUN/Creatinine Ratio: 20 (ref 10–22)
Calcium: 9.1 mg/dL (ref 8.6–10.2)
Creatinine, Ser: 0.81 mg/dL (ref 0.76–1.27)
GFR calc Af Amer: 112 mL/min/{1.73_m2} (ref >59)
GFR calc non Af Amer: 97 mL/min/{1.73_m2} (ref >59)
Globulin, Total: 2.4 g/dL (ref 1.5–4.5)
Sodium: 140 mmol/L (ref 134–144)
Total Bilirubin: 0.2 mg/dL (ref 0.0–1.2)

## 2013-05-17 LAB — PSA, TOTAL AND FREE: PSA: 0.3 ng/mL (ref 0.0–4.0)

## 2013-05-20 ENCOUNTER — Other Ambulatory Visit (INDEPENDENT_AMBULATORY_CARE_PROVIDER_SITE_OTHER): Payer: 59

## 2013-05-20 DIAGNOSIS — Z1212 Encounter for screening for malignant neoplasm of rectum: Secondary | ICD-10-CM

## 2013-05-20 NOTE — Progress Notes (Signed)
Pt dropped off FOBT only 

## 2013-05-22 LAB — FECAL OCCULT BLOOD, IMMUNOCHEMICAL: Fecal Occult Bld: NEGATIVE

## 2014-09-18 ENCOUNTER — Ambulatory Visit (INDEPENDENT_AMBULATORY_CARE_PROVIDER_SITE_OTHER): Payer: 59 | Admitting: Family Medicine

## 2014-09-18 ENCOUNTER — Encounter: Payer: Self-pay | Admitting: Family Medicine

## 2014-09-18 VITALS — BP 122/80 | HR 76 | Temp 98.1°F | Wt 190.0 lb

## 2014-09-18 DIAGNOSIS — Z86010 Personal history of colonic polyps: Secondary | ICD-10-CM

## 2014-09-18 DIAGNOSIS — Z0000 Encounter for general adult medical examination without abnormal findings: Secondary | ICD-10-CM

## 2014-09-18 DIAGNOSIS — E785 Hyperlipidemia, unspecified: Secondary | ICD-10-CM

## 2014-09-18 NOTE — Patient Instructions (Signed)
They will call you in October for your next colonoscopy  Get fasting labs as listed. If they cannot provide them all or some at work, come by here for fasting labs and we will obtain.   My main concern is your cholesterol and seeing if you need a cholesterol medication  HIV testing is a 1x screening test per national recommendations for anyone age 62-65

## 2014-09-18 NOTE — Assessment & Plan Note (Signed)
On recall list for 03/2015 for colonoscopy given history adenoma

## 2014-09-18 NOTE — Assessment & Plan Note (Signed)
S: previously on pravasatatin, exercise level is lacking but diet is reasonable A/P: encouraged starting exercise and continue healthy eating. Check lipids through work and if these cannot all be processed he will follow up with [place] for labs.

## 2014-09-18 NOTE — Progress Notes (Signed)
Mike Reddish, MD Phone: [phone]  Subjective:  Patient presents today to establish care as new patient and for annual physical. Chief complaint-noted.   See problem oriented charting with additions: -Former patient at [facility]. Wife is patient here and they wanted to be in the same practice. He has questions about his next colonoscopy. He is indoors a lot and wants to know his vitamin D level. He is interested in prostate cancer screening.  ROS- no rectal bleeding or melenano chest pain or shortness of breath. No myalgias  The following were reviewed and entered/updated in epic: Past Medical History  Diagnosis Date  . Hyperlipidemia    Patient Active Problem List   Diagnosis Date Noted  . History of adenomatous polyp of colon 09/18/2014  . Hyperlipidemia 12/20/2012   Past Surgical History  Procedure Laterality Date  . None      Family History  Problem Relation Age of Onset  . Uterine cancer Mother     8  . Coronary artery disease Father     age 62  . Diabetes Mellitus I Daughter   . Hypertension Brother     Medications- reviewed and updated Current Outpatient Prescriptions  Medication Sig Dispense Refill  . magnesium gluconate (MAGONATE) 500 MG tablet Take 500 mg by mouth 2 (two) times daily.     Allergies-reviewed and updated No Known Allergies  History   Social History  . Marital Status: Married    Spouse Name: N/A  . Number of Children: N/A  . Years of Education: N/A   Social History Main Topics  . Smoking status: Never Smoker   . Smokeless tobacco: Not on file  . Alcohol Use: No  . Drug Use: No  . Sexual Activity: Not on file   Other Topics Concern  . None   Social History Narrative   Married. 1 daughter. No grandkids.    GED      Works in [employer] in [place]. 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       Hobbies: time at home, yardwork    ROS--See HPI , otherwise full ROS was completed and negative except as noted  above  Objective: BP 122/80 mmHg  Pulse 76  Temp(Src) 98.1 F (36.7 C)  Wt 190 lb (86.183 kg) Gen: NAD, resting comfortably HEENT: Mucous membranes are moist. Oropharynx normal. TM obscured by cerumen Eyes: sclera and lids normal, PERRLA Neck: no thyromegaly, no cervical lymphadenopathy CV: RRR no murmurs rubs or gallops Lungs: CTAB no crackles, wheeze, rhonchi Abdomen: soft/nontender/nondistended/normal bowel sounds. No rebound or guarding.  Rectal: normal tone, normal prostate, no masses or tenderness Ext: no edema, 2+ PT pulses, full range of motion of fingers Skin: warm, dry Neuro: 5/5 strength in upper and lower extremities, normal gait, normal reflexes   Assessment/Plan:  64 y.o. male presenting for annual physical.  Health Maintenance counseling: 1. Anticipatory guidance: Patient counseled regarding regular dental exams, wearing seatbelts, wearing sunscreen. Sees Dermatologist once a year.  2. Risk factor reduction:  Advised patient of need for regular exercise (walking currently 2-3x a week) and diet rich and fruits and vegetables to reduce risk of heart attack and stroke.  3. Immunizations/screenings/ancillary studies-advised need for HIV  History of adenomatous polyp of colon On recall list for 03/2015 for colonoscopy given history adenoma   Hyperlipidemia S: previously on pravasatatin, exercise level is lacking but diet is reasonable A/P: encouraged starting exercise and continue healthy eating. Check lipids through work and if these cannot all be processed he will  follow up with [place] for labs.     Jammed L middle finger S: sore for about a month, slowly improving, primarily hurts with extension in the PIP joint A/P: improving, we discussed considering SM referral if does nto continue to improve. Think x-ray likely low yield and doubt fracture  1 year follow up at least for CPE  Advised the following labs which he may get completed through work, he will fax [place] a  copy. He will get the remaining labs at our office.  Orders Placed This Encounter  Procedures  . CBC    [facility]    Standing Status: Future     Number of Occurrences:      Standing Expiration Date: 09/18/2015  . Comprehensive metabolic panel    [facility]    Standing Status: Future     Number of Occurrences:      Standing Expiration Date: 09/18/2015    Order Specific Question:  Has the patient fasted?    Answer:  No  . Lipid panel    [facility]    Standing Status: Future     Number of Occurrences:      Standing Expiration Date: 09/18/2015    Order Specific Question:  Has the patient fasted?    Answer:  No  . TSH    [facility]    Standing Status: Future     Number of Occurrences:      Standing Expiration Date: 09/18/2015  . PSA    Standing Status: Future     Number of Occurrences:      Standing Expiration Date: 09/18/2015  . Vit D  25 hydroxy (rtn osteoporosis monitoring)    [facility]    Standing Status: Future     Number of Occurrences:      Standing Expiration Date: 09/18/2015  . HIV antibody    [facility]    Standing Status: Future     Number of Occurrences:      Standing Expiration Date: 09/18/2015  . POCT urinalysis dipstick    Standing Status: Future     Number of Occurrences:      Standing Expiration Date: 09/18/2015

## 2014-09-26 LAB — CBC AND DIFFERENTIAL
HCT: 40 % — AB (ref 41–53)
Hemoglobin: 13.4 g/dL — AB (ref 13.5–17.5)
NEUTROS ABS: 2 /uL
Platelets: 188 10*3/uL (ref 150–399)
WBC: 4.6 10^3/mL

## 2014-09-26 LAB — BASIC METABOLIC PANEL
BUN: 13 mg/dL (ref 4–21)
Creatinine: 1 mg/dL (ref <=1.3)
Glucose: 101 mg/dL
Potassium: 4.8 mmol/L (ref 3.4–5.3)
Sodium: 140 mmol/L (ref 137–147)

## 2014-09-26 LAB — HEPATIC FUNCTION PANEL
ALK PHOS: 57 U/L (ref 25–125)
ALT: 18 U/L (ref 10–40)
AST: 24 U/L (ref 14–40)
Bilirubin, Total: 0.6 mg/dL

## 2014-09-26 LAB — LIPID PANEL
Cholesterol: 216 mg/dL — AB (ref 0–200)
HDL: 47 mg/dL (ref 35–70)
LDL Cholesterol: 150 mg/dL
Triglycerides: 96 mg/dL (ref 40–160)

## 2014-09-26 LAB — TSH: TSH: 1.35 u[IU]/mL (ref <=5.90)

## 2014-09-26 LAB — PSA: PSA: 0.4

## 2014-10-17 ENCOUNTER — Encounter: Payer: Self-pay | Admitting: Family Medicine

## 2014-10-17 ENCOUNTER — Telehealth: Payer: Self-pay | Admitting: Family Medicine

## 2014-10-17 NOTE — Telephone Encounter (Signed)
Pt mailed in lab results and would like to confirm that dr hunter received these results. Then would like to know what dr hunter thinks about these labs.

## 2014-10-17 NOTE — Telephone Encounter (Signed)
Bevelyn Ngo thanks for abstracting them when they arrived.   Concerns 1. Blood sugar 101 places him at risk for diabetes. We had discussed exercise and continuing healthy eating which I still think is most important thing here.  2. Cholesterol total 216 above goal 200 and bad cholesterol above goal 100 at 150. 10 year cardiac risk calculates out at 9.6%. With his father's history I think it would be reasonable to restart pravastatin 40mg  #90 daily. Alternatively, he can work on the exercise and we can reassess in a year. I think above 12% risk I would push more heavily for a  Statin. Some current guidelines recommend starting above 7.5%.  3. They did not check HIV or vitamin D which I had ordered. He can certainly come by for just these 2 tests if desired. You can cancel other tests Bevelyn Ngo if that is possible. Or just make sure if he comes in to tell lab that he only needs these 2.   Normal Kidney, liver, electrolytes look great Thyrodi normal Blood counts normal PSA is normal/low range. Trend is more important than 1 number so will continue to trend.

## 2014-10-17 NOTE — Telephone Encounter (Signed)
Thanks, havent gotten anything yet, will get them to Dr. Yong Channel once received.

## 2014-10-18 NOTE — Telephone Encounter (Signed)
Pt notified and stated he will work on his exercise and diet and recheck in a year and he does not wish to have vit D or HIV labs done at this time.

## 2014-10-24 ENCOUNTER — Encounter: Payer: Self-pay | Admitting: Family Medicine

## 2015-01-30 ENCOUNTER — Encounter: Payer: Self-pay | Admitting: Gastroenterology

## 2015-02-02 ENCOUNTER — Encounter: Payer: Self-pay | Admitting: Gastroenterology

## 2015-02-22 ENCOUNTER — Ambulatory Visit (INDEPENDENT_AMBULATORY_CARE_PROVIDER_SITE_OTHER): Payer: 59 | Admitting: Pediatrics

## 2015-02-22 ENCOUNTER — Encounter: Payer: Self-pay | Admitting: Pediatrics

## 2015-02-22 VITALS — BP 126/84 | HR 64 | Temp 97.1°F | Ht 69.5 in | Wt 190.0 lb

## 2015-02-22 DIAGNOSIS — H6123 Impacted cerumen, bilateral: Secondary | ICD-10-CM

## 2015-02-22 NOTE — Patient Instructions (Signed)
Use debrox drops as needed for wax build up.

## 2015-02-22 NOTE — Progress Notes (Signed)
    Subjective:    Patient ID: Mike Hall, male    DOB: 22-Aug-1950, 64 y.o.   MRN: [id number]  HPI: Mike Hall is a 64 y.o. male presenting on 02/22/2015 for Cerumen Impaction   About a week ago started having a stopped up nose. Feels like he has had a little bit of congestion, now feels like he has pressure in his head. No fevers.   Has had ears stopped up in past with wax and needed them cleaned out. Trying OTC ear drops to soften the wax at home with no improvement in symptoms. He has been swimming recently, no pain in ears just pressures.  Relevant past medical, surgical, family and social history reviewed and updated as indicated. Interim medical history since our last visit reviewed. Allergies and medications reviewed and updated.   ROS: Per HPI unless specifically indicated above  Past Medical History Patient Active Problem List   Diagnosis Date Noted  . History of adenomatous polyp of colon 09/18/2014  . Hyperlipidemia 12/20/2012    Current Outpatient Prescriptions  Medication Sig Dispense Refill  . magnesium gluconate (MAGONATE) 500 MG tablet Take 500 mg by mouth 2 (two) times daily.     No current facility-administered medications for this visit.       Objective:    BP 126/84 mmHg  Pulse 64  Temp(Src) 97.1 F (36.2 C) (Oral)  Ht 5' 9.5" (1.765 m)  Wt 190 lb (86.183 kg)  BMI 27.67 kg/m2  Wt Readings from Last 3 Encounters:  02/22/15 190 lb (86.183 kg)  09/18/14 190 lb (86.183 kg)  05/16/13 191 lb (86.637 kg)    Gen: NAD, alert, cooperative with exam, NCAT EYES: EOMI, no scleral injection or icterus ENT:  TMs with impacted dark brown wax, cerumen also present in canals b/l, visible canal not red. OP without erythema LYMPH: no cervical LAD CV: warm, well-perfused Resp:  normal WOB Neuro: Alert and oriented, strength equal b/l UE and LE, coordination grossly normal MSK: normal muscle bulk     Assessment & Plan:   Mike Hall was seen today for cerumen  impaction.  Diagnoses and all orders for this visit:  Cerumen impaction, bilateral Symptoms much improved after removal of cerumen. Normal exam after procedure. See procedure note below. Use debrox drops OTC as needed for future build up of cerumen.    PROCEDURE NOTE: Impacted cerumen removal: After procedure described to patient and patient agreed with proceeding, a cerumen impaction removal squirt bottle system. Using isopropyl alcohol mixed with and water, cerumen impaction was removed from both ears. TMs pearly gray following procedure, no cerumen in canals b/l. Pt tolerated procedure well.    Follow up plan: Return if symptoms worsen or fail to improve.    Assunta Found, MD [facility] 02/22/2015, 5:32 PM

## 2015-04-04 ENCOUNTER — Ambulatory Visit (AMBULATORY_SURGERY_CENTER): Payer: Self-pay

## 2015-04-04 VITALS — Ht 70.5 in | Wt 189.2 lb

## 2015-04-04 DIAGNOSIS — Z860100 Personal history of colon polyps, unspecified: Secondary | ICD-10-CM

## 2015-04-04 MED ORDER — SUPREP BOWEL PREP KIT 17.5-3.13-1.6 GM/177ML PO SOLN: 1.0000 | Freq: Once | ORAL | Status: DC

## 2015-04-04 NOTE — Progress Notes (Signed)
No allergies to eggs or soy No diet/weight loss meds No past problem with anesthesia ; has had colonoscopy before No home oxygen  Has internet; refused emmi

## 2015-04-10 ENCOUNTER — Telehealth: Payer: Self-pay | Admitting: Gastroenterology

## 2015-04-10 NOTE — Telephone Encounter (Signed)
Instructed pt's wife to come to [facility] and get sample of suprep. Instructions remain the same . Instructed to pick up prep 4th floor desk. She states that they will pick up the prep by Friday   Lelan Pons PV

## 2015-04-18 ENCOUNTER — Ambulatory Visit (AMBULATORY_SURGERY_CENTER): Payer: 59 | Admitting: Gastroenterology

## 2015-04-18 ENCOUNTER — Encounter: Payer: Self-pay | Admitting: Gastroenterology

## 2015-04-18 VITALS — BP 128/94 | HR 58 | Temp 95.9°F | Resp 21 | Ht 70.0 in | Wt 189.0 lb

## 2015-04-18 DIAGNOSIS — D122 Benign neoplasm of ascending colon: Secondary | ICD-10-CM

## 2015-04-18 DIAGNOSIS — Z86010 Personal history of colonic polyps: Secondary | ICD-10-CM | POA: Diagnosis not present

## 2015-04-18 MED ORDER — SODIUM CHLORIDE 0.9 % IV SOLN: 500.0000 mL | INTRAVENOUS | Status: DC

## 2015-04-18 NOTE — Op Note (Signed)
[facility]  [address] [place], [address]   COLONOSCOPY PROCEDURE REPORT  PATIENT: Mike Hall, Mike Hall  MR#: [id number] BIRTHDATE: 28-Jul-1950 , 17  yrs. old GENDER: male ENDOSCOPIST: Ladene Artist, MD, [facility] PROCEDURE DATE:  04/18/2015 PROCEDURE:   Colonoscopy, surveillance and Colonoscopy with snare polypectomy First Screening Colonoscopy - Avg.  risk and is 50 yrs.  old or older - No.  Prior Negative Screening - Now for repeat screening. N/A  History of Adenoma - Now for follow-up colonoscopy & has been > or = to 3 yrs.  Yes hx of adenoma.  Has been 3 or more years since last colonoscopy.  Polyps removed today? Yes ASA CLASS:   Class II INDICATIONS:Surveillance due to prior colonic neoplasia and PH Colon Adenoma. MEDICATIONS: Monitored anesthesia care and Propofol 200 mg IV DESCRIPTION OF PROCEDURE:   After the risks benefits and alternatives of the procedure were thoroughly explained, informed consent was obtained.  The digital rectal exam revealed no abnormalities of the rectum.   The LB [id number]  endoscope was introduced through the anus and advanced to the cecum, which was identified by both the appendix and ileocecal valve. No adverse events experienced.   The quality of the prep was excellent. (Suprep was used)  The instrument was then slowly withdrawn as the colon was fully examined. Estimated blood loss is zero unless otherwise noted in this procedure report.    COLON FINDINGS: A sessile polyp measuring 5 mm in size was found in the ascending colon.  A polypectomy was performed with a cold snare.  The resection was complete, the polyp tissue was completely retrieved and sent to histology.   The examination was otherwise normal.  Retroflexed views revealed internal Grade I hemorrhoids. The time to cecum = 1.8 Withdrawal time = 10.7   The scope was withdrawn and the procedure completed. COMPLICATIONS: There were no immediate  complications.  ENDOSCOPIC IMPRESSION: 1.   Sessile polyp in the ascending colon; polypectomy performed with a cold snare 2.   Grade l internal hemorrhoids  RECOMMENDATIONS: 1.  Await pathology results 2.  Repeat Colonoscopy in 5 years.  eSigned:  Ladene Artist, MD, [facility] 04/18/2015 9:36 AM

## 2015-04-18 NOTE — Progress Notes (Signed)
Called to room to assist during endoscopic procedure.  Patient ID and intended procedure confirmed with present staff. Received instructions for my participation in the procedure from the performing physician.  

## 2015-04-18 NOTE — Patient Instructions (Signed)
Colon polyp removed today and hemorrhoids seen. Handouts given on polyps and hemorrhoids. Resume current medications. Repeat colonoscopy in 5 years. Call us with any questions or concerns. Thank you!  YOU HAD AN ENDOSCOPIC PROCEDURE TODAY AT [facility]:   Refer to the procedure report that was given to you for any specific questions about what was found during the examination.  If the procedure report does not answer your questions, please call your gastroenterologist to clarify.  If you requested that your care partner not be given the details of your procedure findings, then the procedure report has been included in a sealed envelope for you to review at your convenience later.  YOU SHOULD EXPECT: Some feelings of bloating in the abdomen. Passage of more gas than usual.  Walking can help get rid of the air that was put into your GI tract during the procedure and reduce the bloating. If you had a lower endoscopy (such as a colonoscopy or flexible sigmoidoscopy) you may notice spotting of blood in your stool or on the toilet paper. If you underwent a bowel prep for your procedure, you may not have a normal bowel movement for a few days.  Please Note:  You might notice some irritation and congestion in your nose or some drainage.  This is from the oxygen used during your procedure.  There is no need for concern and it should clear up in a day or so.  SYMPTOMS TO REPORT IMMEDIATELY:   Following lower endoscopy (colonoscopy or flexible sigmoidoscopy):  Excessive amounts of blood in the stool  Significant tenderness or worsening of abdominal pains  Swelling of the abdomen that is new, acute  Fever of 100F or higher   For urgent or emergent issues, a gastroenterologist can be reached at any hour by calling [phone].   DIET: Your first meal following the procedure should be a small meal and then it is ok to progress to your normal diet. Heavy or fried foods are harder to  digest and may make you feel nauseous or bloated.  Likewise, meals heavy in dairy and vegetables can increase bloating.  Drink plenty of fluids but you should avoid alcoholic beverages for 24 hours.  ACTIVITY:  You should plan to take it easy for the rest of today and you should NOT DRIVE or use heavy machinery until tomorrow (because of the sedation medicines used during the test).    FOLLOW UP: Our staff will call the number listed on your records the next business day following your procedure to check on you and address any questions or concerns that you may have regarding the information given to you following your procedure. If we do not reach you, we will leave a message.  However, if you are feeling well and you are not experiencing any problems, there is no need to return our call.  We will assume that you have returned to your regular daily activities without incident.  If any biopsies were taken you will be contacted by phone or by letter within the next 1-3 weeks.  Please call us at [phone] if you have not heard about the biopsies in 3 weeks.    SIGNATURES/CONFIDENTIALITY: You and/or your care partner have signed paperwork which will be entered into your electronic medical record.  These signatures attest to the fact that that the information above on your After Visit Summary has been reviewed and is understood.  Full responsibility of the confidentiality of this discharge information  lies with you and/or your care-partner.

## 2015-04-18 NOTE — Progress Notes (Signed)
Report to PACU, RN, vss, BBS= Clear.  

## 2015-04-19 ENCOUNTER — Telehealth: Payer: Self-pay | Admitting: Emergency Medicine

## 2015-04-19 NOTE — Telephone Encounter (Signed)
  Follow up Call-  Call back number 04/18/2015  Post procedure Call Back phone  # [phone] cell  Permission to leave phone message Yes     Patient questions:  Do you have a fever, pain , or abdominal swelling? No. Pain Score  0 *  Have you tolerated food without any problems? Yes.    Have you been able to return to your normal activities? Yes.    Do you have any questions about your discharge instructions: Diet   No. Medications  No. Follow up visit  No.  Do you have questions or concerns about your Care? No.  Actions: * If pain score is 4 or above: No action needed, pain <4.

## 2015-04-26 ENCOUNTER — Encounter: Payer: Self-pay | Admitting: Gastroenterology

## 2015-08-16 ENCOUNTER — Encounter: Payer: Self-pay | Admitting: Family Medicine

## 2015-09-04 ENCOUNTER — Encounter: Payer: Self-pay | Admitting: Gastroenterology

## 2015-09-18 ENCOUNTER — Other Ambulatory Visit (INDEPENDENT_AMBULATORY_CARE_PROVIDER_SITE_OTHER): Payer: 59

## 2015-09-18 DIAGNOSIS — Z0000 Encounter for general adult medical examination without abnormal findings: Secondary | ICD-10-CM

## 2015-09-18 LAB — COMPREHENSIVE METABOLIC PANEL
ALBUMIN: 4.4 g/dL (ref 3.5–5.2)
ALT: 17 U/L (ref 0–53)
AST: 20 U/L (ref 0–37)
Alkaline Phosphatase: 55 U/L (ref 39–117)
BILIRUBIN TOTAL: 0.8 mg/dL (ref 0.2–1.2)
BUN: 17 mg/dL (ref 6–23)
CALCIUM: 9.4 mg/dL (ref 8.4–10.5)
CO2: 30 mEq/L (ref 19–32)
CREATININE: 0.92 mg/dL (ref 0.40–1.50)
Chloride: 104 mEq/L (ref 96–112)
GFR: 88.39 mL/min (ref >60.00)
Glucose, Bld: 100 mg/dL — ABNORMAL HIGH (ref 70–99)
Potassium: 4.6 mEq/L (ref 3.5–5.1)
SODIUM: 139 meq/L (ref 135–145)
TOTAL PROTEIN: 6.8 g/dL (ref 6.0–8.3)

## 2015-09-18 LAB — POCT URINALYSIS DIPSTICK
Bilirubin, UA: NEGATIVE
Blood, UA: NEGATIVE
Glucose, UA: NEGATIVE
KETONES UA: NEGATIVE
Leukocytes, UA: NEGATIVE
Nitrite, UA: NEGATIVE
PH UA: 7.5
PROTEIN UA: NEGATIVE
SPEC GRAV UA: 1.015
Urobilinogen, UA: 0.2

## 2015-09-18 LAB — CBC
HCT: 37.8 % — ABNORMAL LOW (ref 39.0–52.0)
Hemoglobin: 12.9 g/dL — ABNORMAL LOW (ref 13.0–17.0)
MCHC: 34.2 g/dL (ref 30.0–36.0)
MCV: 88.1 fl (ref 78.0–100.0)
PLATELETS: 176 10*3/uL (ref 150.0–400.0)
RBC: 4.29 Mil/uL (ref 4.22–5.81)
RDW: 13.2 % (ref 11.5–15.5)
WBC: 4.8 10*3/uL (ref 4.0–10.5)

## 2015-09-18 LAB — LIPID PANEL
CHOLESTEROL: 194 mg/dL (ref 0–200)
HDL: 42.2 mg/dL (ref >39.00)
LDL CALC: 135 mg/dL — AB (ref 0–99)
NonHDL: 151.45
TRIGLYCERIDES: 81 mg/dL (ref 0.0–149.0)
Total CHOL/HDL Ratio: 5
VLDL: 16.2 mg/dL (ref 0.0–40.0)

## 2015-09-18 LAB — VITAMIN D 25 HYDROXY (VIT D DEFICIENCY, FRACTURES): VITD: 24.32 ng/mL — ABNORMAL LOW (ref 30.00–100.00)

## 2015-09-18 LAB — HIV ANTIBODY (ROUTINE TESTING W REFLEX): HIV 1&2 Ab, 4th Generation: NONREACTIVE

## 2015-09-18 LAB — PSA: PSA: 0.34 ng/mL (ref 0.10–4.00)

## 2015-09-18 LAB — TSH: TSH: 1.29 u[IU]/mL (ref 0.35–4.50)

## 2015-09-25 ENCOUNTER — Ambulatory Visit (INDEPENDENT_AMBULATORY_CARE_PROVIDER_SITE_OTHER): Payer: 59 | Admitting: Family Medicine

## 2015-09-25 ENCOUNTER — Encounter: Payer: Self-pay | Admitting: Family Medicine

## 2015-09-25 VITALS — BP 110/70 | HR 72 | Temp 98.5°F | Ht 69.5 in | Wt 188.0 lb

## 2015-09-25 DIAGNOSIS — E785 Hyperlipidemia, unspecified: Secondary | ICD-10-CM

## 2015-09-25 DIAGNOSIS — K5900 Constipation, unspecified: Secondary | ICD-10-CM | POA: Diagnosis not present

## 2015-09-25 DIAGNOSIS — Z0000 Encounter for general adult medical examination without abnormal findings: Secondary | ICD-10-CM

## 2015-09-25 MED ORDER — VITAMIN D (ERGOCALCIFEROL) 1.25 MG (50000 UNIT) PO CAPS: 50000.0000 [IU] | ORAL_CAPSULE | ORAL | Status: DC

## 2015-09-25 NOTE — Progress Notes (Addendum)
Mike Reddish, MD Phone: [phone]  Subjective:  Patient presents today for their annual physical. Chief complaint-noted.   See problem oriented charting- ROS- full  review of systems was completed and negative including No chest pain or shortness of breath. No headache or blurry vision.   The following were reviewed and entered/updated in epic: Past Medical History  Diagnosis Date  . Hyperlipidemia   . Pre-syncope    Patient Active Problem List   Diagnosis Date Noted  . Hyperlipidemia 12/20/2012    Priority: Medium  . History of adenomatous polyp of colon 09/18/2014    Priority: Low  . Constipation 09/25/2015   Past Surgical History  Procedure Laterality Date  . None      Family History  Problem Relation Age of Onset  . Uterine cancer Mother     15  . Coronary artery disease Father     age 74  . Diabetes Mellitus I Daughter   . Hypertension Brother   . Colon cancer Neg Hx     Medications- reviewed and updated Current Outpatient Prescriptions  Medication Sig Dispense Refill  . Flaxseed, Linseed, (FLAXSEED OIL PO) Take 1,000 mg by mouth. 3450 mg    . Magnesium Gluconate (MAGONATE PO) Take 800 mg by mouth.     No current facility-administered medications for this visit.    Allergies-reviewed and updated No Known Allergies  Social History   Social History  . Marital Status: Married    Spouse Name: N/A  . Number of Children: N/A  . Years of Education: N/A   Social History Main Topics  . Smoking status: Never Smoker   . Smokeless tobacco: None  . Alcohol Use: No  . Drug Use: No  . Sexual Activity: Not Asked   Other Topics Concern  . None   Social History Narrative   Married. 1 daughter. No grandkids.    GED      Works in [employer] in [place]. 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       Hobbies: time at home, yardwork    ROS--See HPI   Objective: BP 110/70 mmHg  Pulse 72  Temp(Src) 98.5 F (36.9 C)  Ht 5' 9.5" (1.765 m)  Wt 188  lb (85.276 kg)  BMI 27.37 kg/m2 Gen: NAD, resting comfortably HEENT: Mucous membranes are moist. Oropharynx normal Neck: no thyromegaly CV: RRR no murmurs rubs or gallops Lungs: CTAB no crackles, wheeze, rhonchi Abdomen: soft/nontender/nondistended/normal bowel sounds. No rebound or guarding.  Rectal: normal tone, normal size prostate, no masses or tenderness  Ext: no edema Skin: warm, dry Neuro: grossly normal, moves all extremities, PERRLA  Assessment/Plan:  65 y.o. male presenting for annual physical.  Health Maintenance counseling: 1. Anticipatory guidance: Patient counseled regarding regular dental exams, eye exams, wearing seatbelts.  2. Risk factor reduction:  Advised patient of need for regular exercise and diet rich and fruits and vegetables to reduce risk of heart attack and stroke. Last year was walking 2-3x a week- remains the same today- discussed bumping this up due to sugar level. Diet largely unchanged, stable weight.  Wt Readings from Last 3 Encounters:  09/25/15 188 lb (85.276 kg)  04/18/15 189 lb (85.73 kg)  04/04/15 189 lb 3.2 oz (85.821 kg)  3. Immunizations/screenings/ancillary studies Health Maintenance Due  Topic Date Due  . Hepatitis C Screening -next labs 24-Jul-1950   4. Prostate cancer screening- low risk based off psa and rectal   Lab Results  Component Value Date   PSA 0.34 09/18/2015  PSA 0.4 09/26/2014   PSA 0.3 05/16/2013   5. Colon cancer screening - 04/18/15 adenoma x1 with plan for 5 year repeat.  6. Skin cancer screening- sees dermatology once a year, wearing sunscreen but all the time- doing better.   Hyperlipidemia S: mild poor control on no statin with LDL 135. No myalgias.  Lab Results  Component Value Date   CHOL 194 09/18/2015   HDL 42.20 09/18/2015   LDLCALC 135* 09/18/2015   TRIG 81.0 09/18/2015   CHOLHDL 5 09/18/2015   A/P: 10 year ascvd risk is 8.6%. Declined statin until 10%.   Constipation 25 years of issues. Has been  taking magnesium hydroxide 400mg  twice a day for years, recently over last year increased to 800 mg and allows him to go every other year. We are going to trial miralax 1 capful a day as I have more experience with long term use on miralax  also treat vitamin D with 50k units for 8 weeks Return in about 1 year (around 09/24/2016) for physical. Return precautions advised.   Meds ordered this encounter  Medications  . Magnesium Gluconate (MAGONATE PO)    Sig: Take 800 mg by mouth.  . Vitamin D, Ergocalciferol, (DRISDOL) 50000 units CAPS capsule    Sig: Take 1 capsule (50,000 Units total) by mouth every 7 (seven) days.    Dispense:  8 capsule    Refill:  0

## 2015-09-25 NOTE — Assessment & Plan Note (Signed)
25 years of issues. Has been taking magnesium hydroxide 400mg  twice a day for years, recently over last year increased to 800 mg and allows him to go every other year. We are going to trial miralax 1 capful a day as I have more experience with long term use on miralax

## 2015-09-25 NOTE — Patient Instructions (Addendum)
We are going to trial miralax 1 capful a day as I have more experience with long term use on miralax May change back to magnesium if not effective. Try to drink at least 60 oz of water a day as well  No other changes today- when 10 year risk of heart attack or stroke gets over 10% we will restart cholesterol medicine

## 2015-09-25 NOTE — Assessment & Plan Note (Signed)
S: mild poor control on no statin with LDL 135. No myalgias.  Lab Results  Component Value Date   CHOL 194 09/18/2015   HDL 42.20 09/18/2015   LDLCALC 135* 09/18/2015   TRIG 81.0 09/18/2015   CHOLHDL 5 09/18/2015   A/P: 10 year ascvd risk is 8.6%. Declined statin until 10%.

## 2015-09-25 NOTE — Addendum Note (Signed)
Addended by: Marin Olp on: 09/25/2015 02:29 PM   Modules accepted: Orders

## 2015-11-20 ENCOUNTER — Other Ambulatory Visit: Payer: Self-pay | Admitting: Family Medicine

## 2016-09-04 ENCOUNTER — Ambulatory Visit (INDEPENDENT_AMBULATORY_CARE_PROVIDER_SITE_OTHER): Payer: 59 | Admitting: Family Medicine

## 2016-09-04 ENCOUNTER — Encounter: Payer: Self-pay | Admitting: Family Medicine

## 2016-09-04 VITALS — BP 126/82 | HR 69 | Temp 97.5°F | Ht 69.5 in | Wt 186.6 lb

## 2016-09-04 DIAGNOSIS — R0982 Postnasal drip: Secondary | ICD-10-CM

## 2016-09-04 DIAGNOSIS — J301 Allergic rhinitis due to pollen: Secondary | ICD-10-CM | POA: Diagnosis not present

## 2016-09-04 MED ORDER — GUAIFENESIN-CODEINE 100-10 MG/5ML PO SOLN: 5.0000 mL | Freq: Three times a day (TID) | ORAL | 0 refills | Status: DC | PRN

## 2016-09-04 NOTE — Patient Instructions (Signed)
Great to meet you!  Start a daily plain zyrtec ( generic is good, not Zyrtec D), also start flonase 2 sprays per nostril once daily.

## 2016-09-04 NOTE — Progress Notes (Signed)
   HPI  Patient presents today here with cough.  Patient explains that he's had nasal congestion, cough, and light frontal headache for 3-4 days.  Cough is centered around nighttime mostly. He does have frequent throat clearing during the daytime.  Patient denies any shortness of breath, fevers, chills. He's been going to work like usual without problem.  He has tried Mucinex without improvement.  PMH: Smoking status noted ROS: Per HPI  Objective: BP 126/82   Pulse 69   Temp 97.5 F (36.4 C) (Oral)   Ht 5' 9.5" (1.765 m)   Wt 186 lb 9.6 oz (84.6 kg)   BMI 27.16 kg/m  Gen: NAD, alert, cooperative with exam HEENT: NCAT, nares with boggy, sense swollen turbinates especially on the right side, TMs normal bilaterally, oropharynx clear and moist CV: RRR, good S1/S2, no murmur Resp: CTABL, no wheezes, non-labored Ext: No edema, warm Neuro: Alert and oriented, No gross deficits  Assessment and plan:  # Acute allergic rhinitis, postnasal drip Symptoms most consistent with postnasal drip Start Zyrtec plus Flonase No signs of serious bacterial infection Nighttime cough-given codeine obtaining cough syrup, discussed short course only and not to drive after taking it. Return to clinic with any concerns or failure to improve as expected.    Meds ordered this encounter  Medications  . guaiFENesin-codeine 100-10 MG/5ML syrup    Sig: Take 5-10 mLs by mouth 3 (three) times daily as needed for cough.    Dispense:  120 mL    Refill:  Trosky, MD [facility] 09/04/2016, 2:58 PM

## 2016-09-18 ENCOUNTER — Other Ambulatory Visit: Payer: Self-pay

## 2016-09-18 DIAGNOSIS — Z125 Encounter for screening for malignant neoplasm of prostate: Secondary | ICD-10-CM

## 2016-09-18 DIAGNOSIS — Z0000 Encounter for general adult medical examination without abnormal findings: Secondary | ICD-10-CM

## 2016-09-18 DIAGNOSIS — Z1159 Encounter for screening for other viral diseases: Secondary | ICD-10-CM

## 2016-09-18 DIAGNOSIS — E785 Hyperlipidemia, unspecified: Secondary | ICD-10-CM

## 2016-09-19 ENCOUNTER — Other Ambulatory Visit (INDEPENDENT_AMBULATORY_CARE_PROVIDER_SITE_OTHER): Payer: 59

## 2016-09-19 DIAGNOSIS — Z125 Encounter for screening for malignant neoplasm of prostate: Secondary | ICD-10-CM

## 2016-09-19 DIAGNOSIS — E785 Hyperlipidemia, unspecified: Secondary | ICD-10-CM | POA: Diagnosis not present

## 2016-09-19 DIAGNOSIS — Z1159 Encounter for screening for other viral diseases: Secondary | ICD-10-CM

## 2016-09-19 DIAGNOSIS — Z0000 Encounter for general adult medical examination without abnormal findings: Secondary | ICD-10-CM | POA: Diagnosis not present

## 2016-09-19 LAB — CBC
HCT: 39.4 % (ref 39.0–52.0)
Hemoglobin: 13.6 g/dL (ref 13.0–17.0)
MCHC: 34.6 g/dL (ref 30.0–36.0)
MCV: 87.8 fl (ref 78.0–100.0)
Platelets: 197 10*3/uL (ref 150.0–400.0)
RBC: 4.49 Mil/uL (ref 4.22–5.81)
RDW: 13 % (ref 11.5–15.5)
WBC: 4.9 10*3/uL (ref 4.0–10.5)

## 2016-09-19 LAB — COMPREHENSIVE METABOLIC PANEL
ALT: 21 U/L (ref 0–53)
AST: 21 U/L (ref 0–37)
Albumin: 4.4 g/dL (ref 3.5–5.2)
Alkaline Phosphatase: 56 U/L (ref 39–117)
BILIRUBIN TOTAL: 0.7 mg/dL (ref 0.2–1.2)
BUN: 18 mg/dL (ref 6–23)
CHLORIDE: 103 meq/L (ref 96–112)
CO2: 31 meq/L (ref 19–32)
CREATININE: 0.95 mg/dL (ref 0.40–1.50)
Calcium: 9.6 mg/dL (ref 8.4–10.5)
GFR: 84.9 mL/min (ref >60.00)
GLUCOSE: 102 mg/dL — AB (ref 70–99)
Potassium: 4.7 mEq/L (ref 3.5–5.1)
Sodium: 139 mEq/L (ref 135–145)
Total Protein: 6.8 g/dL (ref 6.0–8.3)

## 2016-09-19 LAB — LIPID PANEL
CHOL/HDL RATIO: 5
Cholesterol: 221 mg/dL — ABNORMAL HIGH (ref 0–200)
HDL: 45.8 mg/dL (ref >39.00)
LDL CALC: 162 mg/dL — AB (ref 0–99)
NONHDL: 175.66
TRIGLYCERIDES: 69 mg/dL (ref 0.0–149.0)
VLDL: 13.8 mg/dL (ref 0.0–40.0)

## 2016-09-19 LAB — PSA: PSA: 0.35 ng/mL (ref 0.10–4.00)

## 2016-09-20 LAB — HEPATITIS C ANTIBODY: HCV AB: NEGATIVE

## 2016-09-26 ENCOUNTER — Ambulatory Visit (INDEPENDENT_AMBULATORY_CARE_PROVIDER_SITE_OTHER): Payer: 59 | Admitting: Family Medicine

## 2016-09-26 ENCOUNTER — Encounter: Payer: Self-pay | Admitting: Family Medicine

## 2016-09-26 VITALS — BP 106/80 | HR 65 | Temp 97.9°F | Ht 70.0 in | Wt 186.6 lb

## 2016-09-26 DIAGNOSIS — E7800 Pure hypercholesterolemia, unspecified: Secondary | ICD-10-CM

## 2016-09-26 DIAGNOSIS — Z0000 Encounter for general adult medical examination without abnormal findings: Secondary | ICD-10-CM

## 2016-09-26 NOTE — Patient Instructions (Addendum)
Let us know by January if you want to do shingrix. Can do a nurse visit for both shots- 2-3 months apart  Need to get exercise up to 150 minutes a week. Try to eat a healthier diet- see tips below  ______________________________________________________________________  Starting October 1st 2018, I will be transferring to our new location: [facility] (corner of [address] [place] [employer]) [place], [place] Phone: [phone]  I would love to have you remain my patient at this new location as long as it remains convenient for you. I am excited about the opportunity to have x-ray and sports medicine in the new building but will really miss the awesome staff and physicians at [facility]. Continue to schedule appointments at [facility] and we will automatically transfer them to the [place] location starting October 1st.   ____________________________________________________________________    Mediterranean Diet A Mediterranean diet refers to food and lifestyle choices that are based on the traditions of countries located on the [employer]. This way of eating has been shown to help prevent certain conditions and improve outcomes for people who have chronic diseases, like kidney disease and heart disease. What are tips for following this plan? Lifestyle   Cook and eat meals together with your family, when possible.  Drink enough fluid to keep your urine clear or pale yellow.  Be physically active every day. This includes:  Aerobic exercise like running or swimming.  Leisure activities like gardening, walking, or housework.  Get 7-8 hours of sleep each night.  If recommended by your health care provider, drink red wine in moderation. This means 1 glass a day for nonpregnant women and 2 glasses a day for men. A glass of wine equals 5 oz (150 mL). Reading food labels   Check the  serving size of packaged foods. For foods such as rice and pasta, the serving size refers to the amount of cooked product, not dry.  Check the total fat in packaged foods. Avoid foods that have saturated fat or trans fats.  Check the ingredients list for added sugars, such as corn syrup. Shopping   At the grocery store, buy most of your food from the areas near the walls of the store. This includes:  Fresh fruits and vegetables (produce).  Grains, beans, nuts, and seeds. Some of these may be available in unpackaged forms or large amounts (in bulk).  Fresh seafood.  Poultry and eggs.  Low-fat dairy products.  Buy whole ingredients instead of prepackaged foods.  Buy fresh fruits and vegetables in-season from local farmers markets.  Buy frozen fruits and vegetables in resealable bags.  If you do not have access to quality fresh seafood, buy precooked frozen shrimp or canned fish, such as tuna, salmon, or sardines.  Buy small amounts of raw or cooked vegetables, salads, or olives from the deli or salad bar at your store.  Stock your pantry so you always have certain foods on hand, such as olive oil, canned tuna, canned tomatoes, rice, pasta, and beans. Cooking   Cook foods with extra-virgin olive oil instead of using butter or other vegetable oils.  Have meat as a side dish, and have vegetables or grains as your main dish. This means having meat in small portions or adding small amounts of meat to foods like pasta or stew.  Use beans or vegetables instead of meat in common dishes like chili or lasagna.  Experiment  with different cooking methods. Try roasting or broiling vegetables instead of steaming or sauteing them.  Add frozen vegetables to soups, stews, pasta, or rice.  Add nuts or seeds for added healthy fat at each meal. You can add these to yogurt, salads, or vegetable dishes.  Marinate fish or vegetables using olive oil, lemon juice, garlic, and fresh herbs. Meal  planning   Plan to eat 1 vegetarian meal one day each week. Try to work up to 2 vegetarian meals, if possible.  Eat seafood 2 or more times a week.  Have healthy snacks readily available, such as:  Vegetable sticks with hummus.  Greek yogurt.  Fruit and nut trail mix.  Eat balanced meals throughout the week. This includes:  Fruit: 2-3 servings a day  Vegetables: 4-5 servings a day  Low-fat dairy: 2 servings a day  Fish, poultry, or lean meat: 1 serving a day  Beans and legumes: 2 or more servings a week  Nuts and seeds: 1-2 servings a day  Whole grains: 6-8 servings a day  Extra-virgin olive oil: 3-4 servings a day  Limit red meat and sweets to only a few servings a month What are my food choices?  Mediterranean diet  Recommended  Grains: Whole-grain pasta. Brown rice. Bulgar wheat. Polenta. Couscous. Whole-wheat bread. [place].  Vegetables: Artichokes. Beets. Broccoli. Cabbage. Carrots. Eggplant. Green beans. Chard. Kale. Spinach. Onions. Leeks. Peas. Squash. Tomatoes. Peppers. Radishes.  Fruits: Apples. Apricots. Avocado. Berries. Bananas. Cherries. Dates. Figs. Grapes. Lemons. Melon. Oranges. Peaches. Plums. Pomegranate.  Meats and other protein foods: Beans. Almonds. Sunflower seeds. Pine nuts. Peanuts. [place]. Salmon. Scallops. Shrimp. [place]. Tilapia. Clams. Oysters. Eggs.  Dairy: Low-fat milk. Cheese. Greek yogurt.  Beverages: Water. Red wine. Herbal tea.  Fats and oils: Extra virgin olive oil. Avocado oil. Grape seed oil.  Sweets and desserts: [place] yogurt with honey. Baked apples. Poached pears. Trail mix.  Seasoning and other foods: Basil. Cilantro. Coriander. Cumin. Mint. Parsley. Sage. Rosemary. Tarragon. Garlic. Oregano. Thyme. Pepper. Balsalmic vinegar. Tahini. Hummus. Tomato sauce. Olives. Mushrooms.  Limit these  Grains: Prepackaged pasta or rice dishes. Prepackaged cereal with added sugar.  Vegetables: Deep fried potatoes (french  fries).  Fruits: Fruit canned in syrup.  Meats and other protein foods: Beef. Pork. Lamb. Poultry with skin. Hot dogs. Berniece Salines.  Dairy: Ice cream. Sour cream. Whole milk.  Beverages: Juice. Sugar-sweetened soft drinks. Beer. Liquor and spirits.  Fats and oils: Butter. Canola oil. Vegetable oil. Beef fat (tallow). Lard.  Sweets and desserts: Cookies. Cakes. Pies. Candy.  Seasoning and other foods: Mayonnaise. Premade sauces and marinades.  The items listed may not be a complete list. Talk with your dietitian about what dietary choices are right for you. Summary  The Mediterranean diet includes both food and lifestyle choices.  Eat a variety of fresh fruits and vegetables, beans, nuts, seeds, and whole grains.  Limit the amount of red meat and sweets that you eat.  Talk with your health care provider about whether it is safe for you to drink red wine in moderation. This means 1 glass a day for nonpregnant women and 2 glasses a day for men. A glass of wine equals 5 oz (150 mL). This information is not intended to replace advice given to you by your health care provider. Make sure you discuss any questions you have with your health care provider. Document Released: 01/17/2016 Document Revised: 02/19/2016 Document Reviewed: 01/17/2016 Elsevier Interactive Patient Education  2017 [employer].

## 2016-09-26 NOTE — Assessment & Plan Note (Signed)
S: poorly controlled on no rx. No myalgias.  Lab Results  Component Value Date   CHOL 221 (H) 09/19/2016   HDL 45.80 09/19/2016   LDLCALC 162 (H) 09/19/2016   TRIG 69.0 09/19/2016   CHOLHDL 5 09/19/2016   A/P: 10 year risk 9.1%- slowly climbing- we agreed 10% cut off still

## 2016-09-26 NOTE — Progress Notes (Signed)
Phone: [phone]  Subjective:  Patient presents today for their annual physical. Chief complaint-noted.   See problem oriented charting- ROS- full  review of systems was completed and negative including No chest pain or shortness of breath. No headache or blurry vision. BM every other day  The following were reviewed and entered/updated in epic: Past Medical History:  Diagnosis Date  . Hyperlipidemia   . Pre-syncope    Patient Active Problem List   Diagnosis Date Noted  . Hyperlipidemia 12/20/2012    Priority: Medium  . History of adenomatous polyp of colon 09/18/2014    Priority: Low  . Constipation 09/25/2015   Past Surgical History:  Procedure Laterality Date  . none      Family History  Problem Relation Age of Onset  . Uterine cancer Mother     30  . Coronary artery disease Father     age 33  . Diabetes Mellitus I Daughter   . Hypertension Brother   . Colon cancer Neg Hx     Medications- reviewed and updated Current Outpatient Prescriptions  Medication Sig Dispense Refill  . cetirizine (ZYRTEC) 10 MG tablet Take 10 mg by mouth daily.    . Flaxseed, Linseed, (FLAXSEED OIL PO) Take 1,000 mg by mouth. 3450 mg    . fluticasone (FLONASE) 50 MCG/ACT nasal spray Place 2 sprays into both nostrils daily.    . Magnesium Gluconate (MAGONATE PO) Take 1,200 mg by mouth.      No current facility-administered medications for this visit.     Allergies-reviewed and updated No Known Allergies  Social History   Social History  . Marital status: Married    Spouse name: N/A  . Number of children: N/A  . Years of education: N/A   Social History Main Topics  . Smoking status: Never Smoker  . Smokeless tobacco: Never Used  . Alcohol use No  . Drug use: No  . Sexual activity: Not Asked   Other Topics Concern  . None   Social History Narrative   Married. 1 daughter. No grandkids.    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       Hobbies: time at home, yardwork    Objective: BP 106/80 (BP Location: Left Arm, Patient Position: Sitting, Cuff Size: Large)   Pulse 65   Temp 97.9 F (36.6 C) (Oral)   Ht 5\' 10"  (1.778 m)   Wt 186 lb 9.6 oz (84.6 kg)   SpO2 94%   BMI 26.77 kg/m  Gen: NAD, resting comfortably HEENT: Mucous membranes are moist. Oropharynx normal Neck: no thyromegaly CV: RRR no murmurs rubs or gallops Lungs: CTAB no crackles, wheeze, rhonchi Abdomen: soft/nontender/nondistended/normal bowel sounds. No rebound or guarding.  Ext: no edema Skin: warm, dry Neuro: grossly normal, moves all extremities, PERRLA Rectal: normal tone, normal sized prostate, no masses or tenderness Hemorrhoid tags noted  Assessment/Plan:  66 y.o. male presenting for annual physical.  Health Maintenance counseling: 1. Anticipatory guidance: Patient counseled regarding regular dental exams q6 months, eye exams -every 2-3 years, wearing seatbelts.  2. Risk factor reduction:  Advised patient of need for regular exercise and diet rich and fruits and vegetables to reduce risk of heart attack and stroke. Exercise- walking 2x a week for 2 miles- advised 150 minutes a week. Diet-needs to cut sweets down some- discussed reducing.  Wt Readings from Last 3 Encounters:  09/26/16 186 lb 9.6 oz (84.6 kg)  09/04/16 186  lb 9.6 oz (84.6 kg)  09/25/15 188 lb (85.3 kg)  3. Immunizations/screenings/ancillary studies- will start shingrix x2 today. Return 2-3 months Immunization History  Administered Date(s) Administered  . Influenza Split 03/03/2013  . Influenza,inj,Quad PF,36+ Mos 02/28/2014, 03/08/2015, 02/26/2016  . Zoster 05/16/2013   4. Prostate cancer screening- very low risk psa trend and rectal exam  Lab Results  Component Value Date   PSA 0.35 09/19/2016   PSA 0.34 09/18/2015   PSA 0.4 09/26/2014   5. Colon cancer screening - 04/2015  With 5 year follow up 6. Skin cancer screening- sees derm once a year. Wears  sunscreen regularly.   Status of chronic or acute concerns   Seasonal allergies on zyrtec and flonase  Hyperlipidemia S: poorly controlled on no rx. No myalgias.  Lab Results  Component Value Date   CHOL 221 (H) 09/19/2016   HDL 45.80 09/19/2016   LDLCALC 162 (H) 09/19/2016   TRIG 69.0 09/19/2016   CHOLHDL 5 09/19/2016   A/P: 10 year risk 9.1%- slowly climbing- we agreed 10% cut off still  Will add vitamin D next year.   1 year CPE  Meds ordered this encounter  Medications  . fluticasone (FLONASE) 50 MCG/ACT nasal spray    Sig: Place 2 sprays into both nostrils daily.  . cetirizine (ZYRTEC) 10 MG tablet    Sig: Take 10 mg by mouth daily.    Return precautions advised.   Garret Reddish, MD

## 2016-09-26 NOTE — Progress Notes (Signed)
Pre visit review using our clinic review tool, if applicable. No additional management support is needed unless otherwise documented below in the visit note. 

## 2017-05-04 ENCOUNTER — Encounter: Payer: Self-pay | Admitting: Family Medicine

## 2017-05-04 ENCOUNTER — Ambulatory Visit: Payer: 59 | Admitting: Family Medicine

## 2017-05-04 VITALS — BP 139/89 | HR 76 | Temp 98.3°F | Ht 70.0 in | Wt 186.0 lb

## 2017-05-04 DIAGNOSIS — J0100 Acute maxillary sinusitis, unspecified: Secondary | ICD-10-CM | POA: Diagnosis not present

## 2017-05-04 DIAGNOSIS — H6123 Impacted cerumen, bilateral: Secondary | ICD-10-CM | POA: Diagnosis not present

## 2017-05-04 DIAGNOSIS — J00 Acute nasopharyngitis [common cold]: Secondary | ICD-10-CM

## 2017-05-04 MED ORDER — FLUTICASONE PROPIONATE 50 MCG/ACT NA SUSP: 2.0000 | Freq: Every day | NASAL | 1 refills | Status: DC

## 2017-05-04 MED ORDER — BENZONATATE 100 MG PO CAPS: 100.0000 mg | ORAL_CAPSULE | Freq: Three times a day (TID) | ORAL | 0 refills | Status: DC | PRN

## 2017-05-04 NOTE — Patient Instructions (Signed)
This appears to be allergic.  Continue the Zyrtec every night at bedtime.  I prescribed the Flonase nasal spray to use 2 sprays in each nostril once daily.  As we discussed, sometimes this can take a few days before he start noticing full effect.  I also sent in [facility] for use as needed for coughing.  If he develop any signs or symptoms of infection, including fevers or purulence from your nose, please seek reevaluation as he may need an antibiotic.  Allergic Rhinitis Allergic rhinitis is when the mucous membranes in the nose respond to allergens. Allergens are particles in the air that cause your body to have an allergic reaction. This causes you to release allergic antibodies. Through a chain of events, these eventually cause you to release histamine into the blood stream. Although meant to protect the body, it is this release of histamine that causes your discomfort, such as frequent sneezing, congestion, and an itchy, runny nose. What are the causes? Seasonal allergic rhinitis (hay fever) is caused by pollen allergens that may come from grasses, trees, and weeds. Year-round allergic rhinitis (perennial allergic rhinitis) is caused by allergens such as house dust mites, pet dander, and mold spores. What are the signs or symptoms?  Nasal stuffiness (congestion).  Itchy, runny nose with sneezing and tearing of the eyes. How is this diagnosed? Your health care provider can help you determine the allergen or allergens that trigger your symptoms. If you and your health care provider are unable to determine the allergen, skin or blood testing may be used. Your health care provider will diagnose your condition after taking your health history and performing a physical exam. Your health care provider may assess you for other related conditions, such as asthma, pink eye, or an ear infection. How is this treated? Allergic rhinitis does not have a cure, but it can be controlled by:  Medicines that  block allergy symptoms. These may include allergy shots, nasal sprays, and oral antihistamines.  Avoiding the allergen.  Hay fever may often be treated with antihistamines in pill or nasal spray forms. Antihistamines block the effects of histamine. There are over-the-counter medicines that may help with nasal congestion and swelling around the eyes. Check with your health care provider before taking or giving this medicine. If avoiding the allergen or the medicine prescribed do not work, there are many new medicines your health care provider can prescribe. Stronger medicine may be used if initial measures are ineffective. Desensitizing injections can be used if medicine and avoidance does not work. Desensitization is when a patient is given ongoing shots until the body becomes less sensitive to the allergen. Make sure you follow up with your health care provider if problems continue. Follow these instructions at home: It is not possible to completely avoid allergens, but you can reduce your symptoms by taking steps to limit your exposure to them. It helps to know exactly what you are allergic to so that you can avoid your specific triggers. Contact a health care provider if:  You have a fever.  You develop a cough that does not stop easily (persistent).  You have shortness of breath.  You start wheezing.  Symptoms interfere with normal daily activities. This information is not intended to replace advice given to you by your health care provider. Make sure you discuss any questions you have with your health care provider. Document Released: 02/18/2001 Document Revised: 01/25/2016 Document Reviewed: 01/31/2013 Elsevier Interactive Patient Education  2017 [employer].

## 2017-05-04 NOTE — Progress Notes (Signed)
Subjective: CC: rhinorrhea PCP: Marin Olp, MD HPI:Mike Hall is a 66 y.o. male presenting to clinic today for:  1. Rhinorrhea Patient reports onset of sinus pressure, rhinorrhea and sinus drainage about 3 weeks ago.  He notes that he initially had a sore throat but this resolved after starting Zyrtec.  He continues to have postnasal drip and now has a sinus headache that started a few days ago.  He notes nasal congestion.  Denies nasal purulence, fevers, sick contacts, recent travel, nausea, vomiting, diarrhea, chest pain, shortness of breath.  He does report a mild nonproductive cough.  He notes similar symptoms in the spring which were relieved by Zyrtec, Flonase and a cough medication.  No Known Allergies Past Medical History:  Diagnosis Date  . Hyperlipidemia   . Pre-syncope    Family History  Problem Relation Age of Onset  . Uterine cancer Mother        70  . Coronary artery disease Father        age 79  . Diabetes Mellitus I Daughter   . Hypertension Brother   . Colon cancer Neg Hx     Current Outpatient Medications:  .  cetirizine (ZYRTEC) 10 MG tablet, Take 10 mg by mouth daily., Disp: , Rfl:  .  Flaxseed, Linseed, (FLAXSEED OIL PO), Take 1,000 mg by mouth. 3450 mg, Disp: , Rfl:  .  Magnesium Gluconate (MAGONATE PO), Take 1,200 mg by mouth. , Disp: , Rfl:   Social Hx: non smoker.  ROS: Per HPI  Objective: Office vital signs reviewed. BP 139/89   Pulse 76   Temp 98.3 F (36.8 C) (Oral)   Ht 5\' 10"  (1.778 m)   Wt 186 lb (84.4 kg)   BMI 26.69 kg/m   Physical Examination:  General: Awake, alert, well nourished, well appearing male, No acute distress HEENT: Normal    Neck: No masses palpated. No lymphadenopathy    Ears: Bilaterally TM initially obscured by cerumen.  Post irrigation, Right Tympanic membranes intact, normal light reflex, no erythema, no bulging; Left TM continues to be obscured by cerumen.    Eyes: PERRLA, extraocular membranes  intact, sclera white    Nose: nasal turbinates moist, clear nasal discharge; small hemostatic hemorrhage appreciated on the medial aspect of the right nare.     Throat: moist mucus membranes, cobblestoning of the oropharynx appreciated. no tonsillar exudate.  Airway is patent Cardio: regular rate and rhythm, S1S2 heard, no murmurs appreciated Pulm: clear to auscultation bilaterally, no wheezes, rhonchi or rales; normal work of breathing on room air  Assessment/ Plan: 66 y.o. male   1. Acute non-recurrent maxillary sinusitis Findings support allergic sinusitis/rhinitis.  Continue Zyrtec daily.  I have added Flonase to use daily.  I recommended he modification, saline rinses, pushing oral clear fluids.  If he develops any signs or symptoms of infection, I did recommend that he follow-up with PCP for reevaluation and possible initiation of antibiotics.  Home care instructions were reviewed.  Patient will follow-up as needed.  2. Acute rhinitis As above  3. Bilateral impacted cerumen Right ear was successfully irrigated.  TM did not appear infected.  Left TM persistently obscured by cerumen.  I recommended that he obtain Debrox and use as directed per package instructions.  Meds ordered this encounter  Medications  . fluticasone (FLONASE) 50 MCG/ACT nasal spray    Sig: Place 2 sprays into both nostrils daily.    Dispense:  16 g    Refill:  1  . benzonatate (TESSALON PERLES) 100 MG capsule    Sig: Take 1 capsule (100 mg total) by mouth 3 (three) times daily as needed for cough.    Dispense:  20 capsule    Refill:  [place], DO [place] [phone]

## 2017-11-23 ENCOUNTER — Telehealth: Payer: Self-pay | Admitting: Family Medicine

## 2017-11-23 NOTE — Telephone Encounter (Signed)
I called the patient to reschedule his physical on 12/01/17. I advised the patient the next available physical date is not until August. The patient states he needs to have his physical done before the end of this month due to insurance. Please advise on how we can take care of the patient's physical request.

## 2017-11-24 NOTE — Telephone Encounter (Signed)
See note

## 2017-11-24 NOTE — Telephone Encounter (Signed)
Patient is calling back to check on the status of this. His wife is coming 11/27/17 at 8:45. Would like to know is it possible for him to be seen then as well.

## 2017-11-25 NOTE — Telephone Encounter (Signed)
Patient scheduled.

## 2017-12-01 ENCOUNTER — Encounter: Payer: 59 | Admitting: Family Medicine

## 2017-12-02 ENCOUNTER — Encounter: Payer: Self-pay | Admitting: Family Medicine

## 2017-12-02 ENCOUNTER — Ambulatory Visit (INDEPENDENT_AMBULATORY_CARE_PROVIDER_SITE_OTHER): Payer: BLUE CROSS/BLUE SHIELD | Admitting: Family Medicine

## 2017-12-02 VITALS — BP 94/66 | HR 60 | Temp 98.2°F | Ht 70.0 in | Wt 184.0 lb

## 2017-12-02 DIAGNOSIS — Z125 Encounter for screening for malignant neoplasm of prostate: Secondary | ICD-10-CM

## 2017-12-02 DIAGNOSIS — E785 Hyperlipidemia, unspecified: Secondary | ICD-10-CM

## 2017-12-02 DIAGNOSIS — E559 Vitamin D deficiency, unspecified: Secondary | ICD-10-CM | POA: Diagnosis not present

## 2017-12-02 DIAGNOSIS — Z0000 Encounter for general adult medical examination without abnormal findings: Secondary | ICD-10-CM

## 2017-12-02 NOTE — Progress Notes (Signed)
Phone: [phone]  Subjective:  Patient presents today for their annual physical. Chief complaint-noted.   See problem oriented charting- ROS- full  review of systems was completed and negative except for: seasonal allergies  The following were reviewed and entered/updated in epic: Past Medical History:  Diagnosis Date  . Hyperlipidemia   . Pre-syncope    Patient Active Problem List   Diagnosis Date Noted  . Hyperlipidemia 12/20/2012    Priority: Medium  . History of adenomatous polyp of colon 09/18/2014    Priority: Low  . Vitamin D deficiency 12/02/2017  . Constipation 09/25/2015   Past Surgical History:  Procedure Laterality Date  . none      Family History  Problem Relation Age of Onset  . Uterine cancer Mother        63  . Coronary artery disease Father        age 66  . Diabetes Mellitus I Daughter   . Hypertension Brother   . Colon cancer Neg Hx     Medications- reviewed and updated Current Outpatient Medications  Medication Sig Dispense Refill  . cetirizine (ZYRTEC) 10 MG tablet Take 10 mg by mouth daily.    . DENTA 5000 PLUS 1.1 % CREA dental cream USE TO BRUSH TEETH AT BEDTIME, SPIT OUT, THEN FLOSS. DO NOT EAT, DRINK, OR RINSE FOR 30 MINUTES  11  . Flaxseed, Linseed, (FLAXSEED OIL PO) Take 1,000 mg by mouth. 3450 mg    . Magnesium Gluconate (MAGONATE PO) Take 1,200 mg by mouth.      No current facility-administered medications for this visit.     Allergies-reviewed and updated No Known Allergies  Social History   Social History Narrative   Married. 1 daughter. No grandkids.    GED      Works in [employer] in [place]. 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       Hobbies: time at home, yardwork   Objective: BP 94/66 (BP Location: Left Arm, Patient Position: Sitting, Cuff Size: Large)   Pulse 60   Temp 98.2 F (36.8 C) (Oral)   Ht 5\' 10"  (1.778 m)   Wt 184 lb (83.5 kg)   SpO2 95%   BMI 26.40 kg/m  Gen: NAD, resting  comfortably HEENT: Mucous membranes are moist. Oropharynx normal. TM normal on R, on left- impacted by cerumen Neck: no thyromegaly CV: RRR no murmurs rubs or gallops Lungs: CTAB no crackles, wheeze, rhonchi Abdomen: soft/nontender/nondistended/normal bowel sounds. No rebound or guarding.  Ext: no edema Skin: warm, dry Neuro: grossly normal, moves all extremities, PERRLA Rectal: normal tone, normal sized prostate, no masses or tenderness  Assessment/Plan:  67 y.o. male presenting for annual physical.  Health Maintenance counseling: 1. Anticipatory guidance: Patient counseled regarding regular dental exams -q6 months, eye exams - last 3-4 years- he states needs to get in as readers not helping as much, wearing seatbelts.  2. Risk factor reduction:  Advised patient of need for regular exercise and diet rich and fruits and vegetables to reduce risk of heart attack and stroke. Exercise- walking with 5-7 days a week about 2 miles a day. Diet-down 2 lbs from last year- mainly due to exercise. Still with balance diet.  Wt Readings from Last 3 Encounters:  12/02/17 184 lb (83.5 kg)  05/04/17 186 lb (84.4 kg)  09/26/16 186 lb 9.6 oz (84.6 kg)  3. Immunizations/screenings/ancillary studies- - We discussed shingrix availability issues  as well as coverage issues (part D medicare)- I recommended that patient get vaccine at  the pharmacy  Immunization History  Administered Date(s) Administered  . Influenza Split 03/03/2013  . Influenza,inj,Quad PF,6+ Mos 02/28/2014, 03/08/2015, 02/26/2016, 03/17/2017  . Zoster 05/16/2013  4. Prostate cancer screening-  trend psa- low risk in past. Rectal exam low risk Lab Results  Component Value Date   PSA 0.35 09/19/2016   PSA 0.34 09/18/2015   PSA 0.4 09/26/2014   5. Colon cancer screening - 04/2015 with 5 year follow up planned due to history colon polyps 6. Skin cancer screening- derm yearly. advised regular sunscreen use. Denies worrisome, changing, or new  skin lesions.   Status of chronic or acute concerns   4th finger left hand- likely ganglion cyst. Can see Dr. Paulla Fore  Attempted to curette L ear cerumen- not successful. Irrigated by Roselyn Reef afterwards  Hyperlipidemia HLD- update lipids and calculate 10 year risk- with dads CAD history we are using 10 year risk. We discussed briefly using CT coronary scan to stratify risk further. Hoping risk is still below 10% or even better given improved exercise and we can avoid this step.   Vitamin D deficiency Vitamin D deficiency- update with labs. Not on any vitamin D.   Return in about 1 year (around 12/03/2018) for physical.  Lab/Order associations: Preventative health care - Plan: Lipid panel, CBC, Comprehensive metabolic panel, VITAMIN D 25 Hydroxy (Vit-D Deficiency, Fractures), PSA  Screening for prostate cancer - Plan: PSA  Hyperlipidemia, unspecified hyperlipidemia type - Plan: Lipid panel, CBC, Comprehensive metabolic panel  Vitamin D deficiency - Plan: VITAMIN D 25 Hydroxy (Vit-D Deficiency, Fractures)  Return precautions advised.  Garret Reddish, MD

## 2017-12-02 NOTE — Assessment & Plan Note (Signed)
HLD- update lipids and calculate 10 year risk- with dads CAD history we are using 10 year risk. We discussed briefly using CT coronary scan to stratify risk further. Hoping risk is still below 10% or even better given improved exercise and we can avoid this step.

## 2017-12-02 NOTE — Assessment & Plan Note (Signed)
Vitamin D deficiency- update with labs. Not on any vitamin D.

## 2017-12-02 NOTE — Patient Instructions (Addendum)
Schedule a lab visit at the check out desk within 2 weeks. Return for future fasting labs meaning nothing but water after midnight please. Ok to take your medications with water.   Schedule a visit with your eye doctor  Please check with your pharmacy to see if they have the shingrix vaccine. If they do- please get this immunization and update [place] by phone call or mychart with dates you receive the vaccine  Can see Dr. Paulla Fore of sports medicine if cyst on left hand worsens. These are benign but can be annoying.

## 2017-12-03 ENCOUNTER — Other Ambulatory Visit: Payer: Self-pay

## 2017-12-03 ENCOUNTER — Other Ambulatory Visit (INDEPENDENT_AMBULATORY_CARE_PROVIDER_SITE_OTHER): Payer: BLUE CROSS/BLUE SHIELD

## 2017-12-03 DIAGNOSIS — Z125 Encounter for screening for malignant neoplasm of prostate: Secondary | ICD-10-CM | POA: Diagnosis not present

## 2017-12-03 DIAGNOSIS — E559 Vitamin D deficiency, unspecified: Secondary | ICD-10-CM

## 2017-12-03 DIAGNOSIS — Z0000 Encounter for general adult medical examination without abnormal findings: Secondary | ICD-10-CM

## 2017-12-03 DIAGNOSIS — E785 Hyperlipidemia, unspecified: Secondary | ICD-10-CM | POA: Diagnosis not present

## 2017-12-03 LAB — LIPID PANEL
CHOLESTEROL: 209 mg/dL — AB (ref 0–200)
HDL: 40.3 mg/dL (ref >39.00)
LDL Cholesterol: 147 mg/dL — ABNORMAL HIGH (ref 0–99)
NonHDL: 169.1
TRIGLYCERIDES: 111 mg/dL (ref 0.0–149.0)
Total CHOL/HDL Ratio: 5
VLDL: 22.2 mg/dL (ref 0.0–40.0)

## 2017-12-03 LAB — COMPREHENSIVE METABOLIC PANEL
ALBUMIN: 4.5 g/dL (ref 3.5–5.2)
ALK PHOS: 49 U/L (ref 39–117)
ALT: 15 U/L (ref 0–53)
AST: 19 U/L (ref 0–37)
BUN: 18 mg/dL (ref 6–23)
CO2: 30 mEq/L (ref 19–32)
Calcium: 9.5 mg/dL (ref 8.4–10.5)
Chloride: 102 mEq/L (ref 96–112)
Creatinine, Ser: 0.97 mg/dL (ref 0.40–1.50)
GFR: 82.57 mL/min (ref >60.00)
GLUCOSE: 105 mg/dL — AB (ref 70–99)
POTASSIUM: 4.5 meq/L (ref 3.5–5.1)
Sodium: 139 mEq/L (ref 135–145)
TOTAL PROTEIN: 6.8 g/dL (ref 6.0–8.3)
Total Bilirubin: 0.5 mg/dL (ref 0.2–1.2)

## 2017-12-03 LAB — VITAMIN D 25 HYDROXY (VIT D DEFICIENCY, FRACTURES): VITD: 28.64 ng/mL — ABNORMAL LOW (ref 30.00–100.00)

## 2017-12-03 LAB — CBC
HEMATOCRIT: 39.8 % (ref 39.0–52.0)
HEMOGLOBIN: 13.7 g/dL (ref 13.0–17.0)
MCHC: 34.5 g/dL (ref 30.0–36.0)
MCV: 88.4 fl (ref 78.0–100.0)
Platelets: 177 10*3/uL (ref 150.0–400.0)
RBC: 4.5 Mil/uL (ref 4.22–5.81)
RDW: 13.2 % (ref 11.5–15.5)
WBC: 4.7 10*3/uL (ref 4.0–10.5)

## 2017-12-03 LAB — PSA: PSA: 1.25 ng/mL (ref 0.10–4.00)

## 2018-02-02 ENCOUNTER — Encounter: Payer: Self-pay | Admitting: Family Medicine

## 2018-02-02 ENCOUNTER — Other Ambulatory Visit (INDEPENDENT_AMBULATORY_CARE_PROVIDER_SITE_OTHER): Payer: Medicare Other

## 2018-02-02 DIAGNOSIS — Z125 Encounter for screening for malignant neoplasm of prostate: Secondary | ICD-10-CM

## 2018-02-02 LAB — PSA: PSA: 1.05 ng/mL (ref 0.10–4.00)

## 2018-03-31 ENCOUNTER — Ambulatory Visit: Payer: Medicare Other

## 2018-07-05 DIAGNOSIS — R69 Illness, unspecified: Secondary | ICD-10-CM | POA: Diagnosis not present

## 2018-07-23 ENCOUNTER — Encounter: Payer: Self-pay | Admitting: Family Medicine

## 2018-08-02 ENCOUNTER — Other Ambulatory Visit: Payer: Medicare Other

## 2018-08-09 DIAGNOSIS — R69 Illness, unspecified: Secondary | ICD-10-CM | POA: Diagnosis not present

## 2018-08-23 ENCOUNTER — Encounter: Payer: Self-pay | Admitting: Family Medicine

## 2018-08-23 ENCOUNTER — Ambulatory Visit (INDEPENDENT_AMBULATORY_CARE_PROVIDER_SITE_OTHER): Payer: Medicare HMO | Admitting: Family Medicine

## 2018-08-23 ENCOUNTER — Other Ambulatory Visit: Payer: Self-pay

## 2018-08-23 VITALS — BP 120/82 | HR 60 | Temp 97.7°F | Ht 69.5 in | Wt 186.0 lb

## 2018-08-23 DIAGNOSIS — R351 Nocturia: Secondary | ICD-10-CM | POA: Diagnosis not present

## 2018-08-23 DIAGNOSIS — Z86010 Personal history of colonic polyps: Secondary | ICD-10-CM

## 2018-08-23 DIAGNOSIS — E559 Vitamin D deficiency, unspecified: Secondary | ICD-10-CM

## 2018-08-23 DIAGNOSIS — Z0000 Encounter for general adult medical examination without abnormal findings: Secondary | ICD-10-CM

## 2018-08-23 DIAGNOSIS — Z125 Encounter for screening for malignant neoplasm of prostate: Secondary | ICD-10-CM

## 2018-08-23 DIAGNOSIS — E785 Hyperlipidemia, unspecified: Secondary | ICD-10-CM | POA: Diagnosis not present

## 2018-08-23 DIAGNOSIS — Z6827 Body mass index (BMI) 27.0-27.9, adult: Secondary | ICD-10-CM

## 2018-08-23 LAB — COMPREHENSIVE METABOLIC PANEL
ALT: 18 U/L (ref 0–53)
AST: 19 U/L (ref 0–37)
Albumin: 4.5 g/dL (ref 3.5–5.2)
Alkaline Phosphatase: 56 U/L (ref 39–117)
BUN: 15 mg/dL (ref 6–23)
CHLORIDE: 103 meq/L (ref 96–112)
CO2: 28 mEq/L (ref 19–32)
Calcium: 9.5 mg/dL (ref 8.4–10.5)
Creatinine, Ser: 0.97 mg/dL (ref 0.40–1.50)
GFR: 77.51 mL/min (ref >60.00)
GLUCOSE: 101 mg/dL — AB (ref 70–99)
POTASSIUM: 4.5 meq/L (ref 3.5–5.1)
Sodium: 138 mEq/L (ref 135–145)
TOTAL PROTEIN: 6.8 g/dL (ref 6.0–8.3)
Total Bilirubin: 0.7 mg/dL (ref 0.2–1.2)

## 2018-08-23 LAB — CBC
HEMATOCRIT: 40.2 % (ref 39.0–52.0)
HEMOGLOBIN: 13.9 g/dL (ref 13.0–17.0)
MCHC: 34.4 g/dL (ref 30.0–36.0)
MCV: 88.7 fl (ref 78.0–100.0)
Platelets: 176 10*3/uL (ref 150.0–400.0)
RBC: 4.54 Mil/uL (ref 4.22–5.81)
RDW: 13.3 % (ref 11.5–15.5)
WBC: 5.4 10*3/uL (ref 4.0–10.5)

## 2018-08-23 LAB — LIPID PANEL
CHOL/HDL RATIO: 5
Cholesterol: 218 mg/dL — ABNORMAL HIGH (ref 0–200)
HDL: 41.9 mg/dL (ref >39.00)
LDL CALC: 157 mg/dL — AB (ref 0–99)
NONHDL: 176.32
Triglycerides: 99 mg/dL (ref 0.0–149.0)
VLDL: 19.8 mg/dL (ref 0.0–40.0)

## 2018-08-23 LAB — PSA: PSA: 0.57 ng/mL (ref 0.10–4.00)

## 2018-08-23 LAB — VITAMIN D 25 HYDROXY (VIT D DEFICIENCY, FRACTURES): VITD: 39.24 ng/mL (ref 30.00–100.00)

## 2018-08-23 NOTE — Patient Instructions (Addendum)
Health Maintenance Due  Topic Date Due  . PNA vac Low Risk Adult (1 of 2 -  PCV13) Postponed 12/20/2017  . TETANUS/TDAP Please stop by your local pharmacy to have this vaccine administered  07/10/2018    Mike Hall , Thank you for taking time to come for your Medicare Wellness Visit. I appreciate your ongoing commitment to your health goals. Please review the following plan we discussed and let me know if I can assist you in the future.   These are the goals we discussed: 1. Please stop by lab before you go If you do not have mychart- we will call you about results within 5 business days of [place] receiving them.  If you have mychart- we will send your results within 3 business days of [place] receiving them.  If abnormal or we want to clarify a result, we will call or mychart you to make sure you receive the message.  If you have questions or concerns or don't hear within 5-7 days, please send [place] a message or call us.  2. Keep up the great job with exercise!    This is a list of the screening recommended for you and due dates:  Health Maintenance  Topic Date Due  . Tetanus Vaccine  07/10/2018  . Pneumonia vaccines (1 of 2 - PCV13) 10/21/2018*  . Colon Cancer Screening  04/17/2020  . Flu Shot  Completed  .  Hepatitis C: One time screening is recommended by Center for Disease Control  (CDC) for  adults born from [birth year] through 1963.   Completed  . HIV Screening  Completed  *Topic was postponed. The date shown is not the original due date.

## 2018-08-23 NOTE — Progress Notes (Addendum)
Phone: [phone]   Subjective:  Patient presents today for their Welcome to Medicare Exam    Preventive Screening-Counseling & Management  Vision screen: passed- as well as vision screen (mild loss of vision in left and total). Seeing optho -last seen by [facility] physician 2 years ago  Hearing Screening   125Hz  250Hz  500Hz  1000Hz  2000Hz  3000Hz  4000Hz  6000Hz  8000Hz   Right ear:       Pass    Left ear:       Pass      Visual Acuity Screening   Right eye Left eye Both eyes  Without correction: 20/25 20/30 20/30   With correction:      Advanced directives: as of today full code but would not want prolonged heroic measures/prolonged ventilation unless it was thought there could be meaningful recovery  Smoking Status: Never Smoker Second Hand Smoking status: No smokers in home  Risk Factors Regular exercise: Walks 5 to 6 days a week Diet: Reasonably healthy diet-mildly elevated BMI Fall Risk: None  Fall Risk  08/23/2018 05/04/2017 02/22/2015 05/16/2013  Falls in the past year? 0 No No No  Number falls in past yr: 0 - - -  Injury with Fall? 0 - - -   Opioid use history:  no long term opioids use  BMI monitoring- elevated BMI noted: Body mass index is 27.07 kg/m. Encouraged need for healthy eating, continued regular exercise.  BMI Metric Follow Up - 08/23/18 0902      BMI Metric Follow Up-Please document annually   BMI Metric Follow Up  Education provided      Cardiac risk factors:  advanced age (older than 66 for men, 64 for women) - yes Hyperlipidemia -yes with 10-year ASCVD risk at 13.3% using last years labs.  We discussed statin. Has had some myalgias No diabetes.  Family History: Father developed coronary artery disease at age 63   Depression Screen None. PHQ2 0  Depression screen [facility] 2/9 08/23/2018 08/23/2018 05/04/2017 09/04/2016 02/22/2015  Decreased Interest 0 0 0 0 0  Down, Depressed, Hopeless 0 0 0 0 0  PHQ - 2 Score 0 0 0 0 0    Activities of Daily Living  Independent ADLs and IADLs   Hearing Difficulties: -patient declines  Cognitive Testing             No reported trouble.  Mini cog normal- 1/3 recall but normal clock draw.   List the Names of Other Physician/Practitioners you currently use: -optho in [place]  Immunization History  Administered Date(s) Administered  . Influenza Split 03/03/2013  . Influenza, High Dose Seasonal PF 03/19/2018  . Influenza,inj,Quad PF,6+ Mos 02/28/2014, 03/08/2015, 02/26/2016, 03/17/2017  . Influenza-Unspecified 03/19/2018  . Zoster 05/16/2013   Required Immunizations needed today/Screening tests- up to date-declines Tdap and Prevnar today due to current covid-19 pandemic  ROS- No pertinent positives discovered in course of AWV ROS- No chest pain or shortness of breath. No headache or blurry vision.   The following were reviewed and entered/updated in epic: Past Medical History:  Diagnosis Date  . Hyperlipidemia   . Pre-syncope    Patient Active Problem List   Diagnosis Date Noted  . Hyperlipidemia 12/20/2012    Priority: Medium  . History of adenomatous polyp of colon 09/18/2014    Priority: Low  . Vitamin D deficiency 12/02/2017  . Constipation 09/25/2015   Past Surgical History:  Procedure Laterality Date  . none      Family History  Problem Relation Age of Onset  .  Uterine cancer Mother        50  . Coronary artery disease Father        age 53  . Diabetes Mellitus I Daughter   . Hypertension Brother   . Colon cancer Neg Hx     Medications- reviewed and updated Current Outpatient Medications  Medication Sig Dispense Refill  . cetirizine (ZYRTEC) 10 MG tablet Take 10 mg by mouth daily.    . cholecalciferol (VITAMIN D3) 25 MCG (1000 UT) tablet Take by mouth daily.    . DENTA 5000 PLUS 1.1 % CREA dental cream USE TO BRUSH TEETH AT BEDTIME, SPIT OUT, THEN FLOSS. DO NOT EAT, DRINK, OR RINSE FOR 30 MINUTES  11  . Flaxseed, Linseed, (FLAXSEED OIL PO) Take 1,000 mg by mouth.  3450 mg    . Magnesium Gluconate (MAGONATE PO) Take 1,200 mg by mouth.      No current facility-administered medications for this visit.     Allergies-reviewed and updated No Known Allergies  Social History   Socioeconomic History  . Marital status: Married    Spouse name: Not on file  . Number of children: Not on file  . Years of education: Not on file  . Highest education level: Not on file  Occupational History  . Not on file  Social Needs  . Financial resource strain: Not on file  . Food insecurity:    Worry: Not on file    Inability: Not on file  . Transportation needs:    Medical: Not on file    Non-medical: Not on file  Tobacco Use  . Smoking status: Never Smoker  . Smokeless tobacco: Never Used  Substance and Sexual Activity  . Alcohol use: No  . Drug use: No  . Sexual activity: Not on file  Lifestyle  . Physical activity:    Days per week: Not on file    Minutes per session: Not on file  . Stress: Not on file  Relationships  . Social connections:    Talks on phone: Not on file    Gets together: Not on file    Attends religious service: Not on file    Active member of club or organization: Not on file    Attends meetings of clubs or organizations: Not on file    Relationship status: Not on file  Other Topics Concern  . Not on file  Social History Narrative   Married. 1 daughter. No grandkids.    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       Hobbies: time at home, yardwork   Objective  Objective:  BP 120/82 (BP Location: Left Arm, Patient Position: Sitting, Cuff Size: Normal)   Pulse 60   Temp 97.7 F (36.5 C) (Oral)   Ht 5' 9.5" (1.765 m)   Wt 186 lb (84.4 kg)   SpO2 97%   BMI 27.07 kg/m  Gen: NAD, resting comfortably HEENT: Mucous membranes are moist. Oropharynx normal Neck: no thyromegaly CV: RRR no murmurs rubs or gallops Lungs: CTAB no crackles, wheeze, rhonchi Abdomen:  soft/nontender/nondistended/normal bowel sounds. No rebound or guarding.  Ext: no edema Skin: warm, dry Neuro: grossly normal, moves all extremities, PERRLA   Assessment and Plan:   Welcome to Medicare exam completed- discussed recommended screenings and documented any personalized health advice and referrals for preventive counseling. See AVS as well which was given to patient.   Status of chronic or acute concerns  Hyperlipidemia- we will update 10 year ascvd risk and if remains above 10% will retrial pravastatin 40mg   Vitamin D deficiency in the past- taking 2000 units a day - update today  Prostate cancer screening- overall low risk trend- interesting went form 0.35 to 1.25 but both numbers low and repeat trended down to 1.05 so we will repeat today. Nocturia twice a night Lab Results  Component Value Date   PSA 1.05 02/02/2018   PSA 1.25 12/03/2017   PSA 0.35 09/19/2016   1 year CPE   Lab/Order associations: Preventative health care - Plan: CBC, Comprehensive metabolic panel, Lipid panel, PSA, VITAMIN D 25 Hydroxy (Vit-D Deficiency, Fractures)  Hyperlipidemia, unspecified hyperlipidemia type - Plan: CBC, Comprehensive metabolic panel, Lipid panel  Vitamin D deficiency - Plan: VITAMIN D 25 Hydroxy (Vit-D Deficiency, Fractures)  History of adenomatous polyp of colon  BMI 27.0-27.9,adult  Screening for prostate cancer - Plan: PSA  Nocturia - Plan: PSA  Return precautions advised. Garret Reddish, MD

## 2018-08-24 ENCOUNTER — Telehealth: Payer: Self-pay

## 2018-08-24 NOTE — Telephone Encounter (Signed)
She wants him to do the coronary CT first- is that correct?

## 2018-08-24 NOTE — Telephone Encounter (Signed)
From result note dated 08/23/18:  Spoke to patient's wife. Patient has viewed his lab results via [place] and his wife states that Dr. Yong Channel mentioned something at her husband's visit in June of 2019 that could poss be an option for him in lieu of restarting pravastatin.  *from 12/02/17 visit notes*-  Hyperlipidemia HLD- update lipids and calculate 10 year risk- with dads CAD history we are using 10 year risk. We discussed briefly using CT coronary scan to stratify risk further. Hoping risk is still below 10% or even better given improved exercise and we can avoid this step  Patient's wife states patient would like to know if this could still be an option for him as opposed to the pravastatin?   Please advise.

## 2018-08-26 NOTE — Telephone Encounter (Signed)
Correct, they would like to proceed with coronary CT prior to starting Pravastatin. There is also a MyChart message from pt. Will forward that as well if you would like to reply directly to pt with recommendation.

## 2018-08-26 NOTE — Telephone Encounter (Signed)
Noted thanks-will report to my chart

## 2018-12-06 ENCOUNTER — Other Ambulatory Visit: Payer: Self-pay

## 2018-12-06 ENCOUNTER — Encounter (HOSPITAL_COMMUNITY): Payer: Self-pay | Admitting: *Deleted

## 2018-12-06 ENCOUNTER — Observation Stay (HOSPITAL_COMMUNITY)
Admission: EM | Admit: 2018-12-06 | Discharge: 2018-12-07 | Disposition: A | Discharge status: Home / Self Care | Payer: Medicare HMO | Attending: Family Medicine | Admitting: Family Medicine

## 2018-12-06 DIAGNOSIS — Z03818 Encounter for observation for suspected exposure to other biological agents ruled out: Secondary | ICD-10-CM | POA: Diagnosis not present

## 2018-12-06 DIAGNOSIS — I10 Essential (primary) hypertension: Secondary | ICD-10-CM | POA: Diagnosis not present

## 2018-12-06 DIAGNOSIS — Z79899 Other long term (current) drug therapy: Secondary | ICD-10-CM | POA: Diagnosis not present

## 2018-12-06 DIAGNOSIS — R1110 Vomiting, unspecified: Secondary | ICD-10-CM | POA: Diagnosis not present

## 2018-12-06 DIAGNOSIS — Z20828 Contact with and (suspected) exposure to other viral communicable diseases: Secondary | ICD-10-CM | POA: Insufficient documentation

## 2018-12-06 DIAGNOSIS — R42 Dizziness and giddiness: Principal | ICD-10-CM

## 2018-12-06 DIAGNOSIS — R112 Nausea with vomiting, unspecified: Secondary | ICD-10-CM | POA: Diagnosis not present

## 2018-12-06 DIAGNOSIS — R110 Nausea: Secondary | ICD-10-CM | POA: Diagnosis not present

## 2018-12-06 DIAGNOSIS — R1111 Vomiting without nausea: Secondary | ICD-10-CM | POA: Diagnosis not present

## 2018-12-06 NOTE — ED Triage Notes (Signed)
Pt brought in by [facility] for c/o dizziness and emesis that started around 7pm; pt was given a liter of fluid by ems and zofran 4mg  IV en route; pt has hx of vertigo x 6 years ago

## 2018-12-07 ENCOUNTER — Telehealth: Payer: Self-pay | Admitting: Family Medicine

## 2018-12-07 ENCOUNTER — Encounter: Payer: Self-pay | Admitting: Family Medicine

## 2018-12-07 ENCOUNTER — Observation Stay (HOSPITAL_COMMUNITY): Payer: Medicare HMO

## 2018-12-07 DIAGNOSIS — R42 Dizziness and giddiness: Secondary | ICD-10-CM

## 2018-12-07 LAB — BASIC METABOLIC PANEL
Anion gap: 10 (ref 5–15)
BUN: 16 mg/dL (ref 8–23)
CO2: 24 mmol/L (ref 22–32)
Calcium: 8.7 mg/dL — ABNORMAL LOW (ref 8.9–10.3)
Chloride: 104 mmol/L (ref 98–111)
Creatinine, Ser: 0.88 mg/dL (ref 0.61–1.24)
GFR calc Af Amer: >60 mL/min (ref >60)
GFR calc non Af Amer: >60 mL/min (ref >60)
Glucose, Bld: 147 mg/dL — ABNORMAL HIGH (ref 70–99)
Potassium: 4.3 mmol/L (ref 3.5–5.1)
Sodium: 138 mmol/L (ref 135–145)

## 2018-12-07 LAB — CBC WITH DIFFERENTIAL/PLATELET
Abs Immature Granulocytes: 0.02 10*3/uL (ref 0.00–0.07)
Basophils Absolute: 0 10*3/uL (ref 0.0–0.1)
Basophils Relative: 0 %
Eosinophils Absolute: 0 10*3/uL (ref 0.0–0.5)
Eosinophils Relative: 0 %
HCT: 38.8 % — ABNORMAL LOW (ref 39.0–52.0)
Hemoglobin: 13.2 g/dL (ref 13.0–17.0)
Immature Granulocytes: 0 %
Lymphocytes Relative: 7 %
Lymphs Abs: 0.7 10*3/uL (ref 0.7–4.0)
MCH: 29.8 pg (ref 26.0–34.0)
MCHC: 34 g/dL (ref 30.0–36.0)
MCV: 87.6 fL (ref 80.0–100.0)
Monocytes Absolute: 0.3 10*3/uL (ref 0.1–1.0)
Monocytes Relative: 4 %
Neutro Abs: 8.4 10*3/uL — ABNORMAL HIGH (ref 1.7–7.7)
Neutrophils Relative %: 89 %
Platelets: 165 10*3/uL (ref 150–400)
RBC: 4.43 MIL/uL (ref 4.22–5.81)
RDW: 12.6 % (ref 11.5–15.5)
WBC: 9.4 10*3/uL (ref 4.0–10.5)
nRBC: 0 % (ref 0.0–0.2)

## 2018-12-07 MED ORDER — PHENOL 1.4 % MT LIQD: 1.0000 | OROMUCOSAL | Status: DC | PRN

## 2018-12-07 MED ORDER — SODIUM CHLORIDE 0.9 % IV SOLN
INTRAVENOUS | Status: DC
  Administered 2018-12-07: 06:00:00 via INTRAVENOUS

## 2018-12-07 MED ORDER — MECLIZINE HCL 12.5 MG PO TABS
25.0000 mg | ORAL_TABLET | Freq: Once | ORAL | Status: AC
  Administered 2018-12-07: 25 mg via ORAL
  Filled 2018-12-07: qty 2

## 2018-12-07 MED ORDER — PROCHLORPERAZINE EDISYLATE 10 MG/2ML IJ SOLN
INTRAMUSCULAR | Status: AC
  Filled 2018-12-07: qty 2

## 2018-12-07 MED ORDER — PROCHLORPERAZINE EDISYLATE 10 MG/2ML IJ SOLN
10.0000 mg | Freq: Once | INTRAMUSCULAR | Status: AC
  Administered 2018-12-07: 10 mg via INTRAVENOUS

## 2018-12-07 MED ORDER — ACETAMINOPHEN 325 MG PO TABS: 650.0000 mg | ORAL_TABLET | Freq: Four times a day (QID) | ORAL | 0 refills | Status: DC | PRN

## 2018-12-07 MED ORDER — ENOXAPARIN SODIUM 40 MG/0.4ML ~~LOC~~ SOLN
40.0000 mg | SUBCUTANEOUS | Status: DC
  Administered 2018-12-07: 40 mg via SUBCUTANEOUS
  Filled 2018-12-07: qty 0.4

## 2018-12-07 MED ORDER — DROPERIDOL 2.5 MG/ML IJ SOLN: 2.5000 mg | Freq: Once | INTRAMUSCULAR | Status: DC

## 2018-12-07 MED ORDER — MECLIZINE HCL 12.5 MG PO TABS: 25.0000 mg | ORAL_TABLET | Freq: Three times a day (TID) | ORAL | Status: DC | PRN

## 2018-12-07 MED ORDER — ACETAMINOPHEN 650 MG RE SUPP: 650.0000 mg | Freq: Four times a day (QID) | RECTAL | Status: DC | PRN

## 2018-12-07 MED ORDER — ACETAMINOPHEN 325 MG PO TABS: 650.0000 mg | ORAL_TABLET | Freq: Four times a day (QID) | ORAL | Status: DC | PRN

## 2018-12-07 MED ORDER — ONDANSETRON 4 MG PO TBDP: 4.0000 mg | ORAL_TABLET | Freq: Four times a day (QID) | ORAL | 0 refills | Status: DC | PRN

## 2018-12-07 MED ORDER — MECLIZINE HCL 25 MG PO TABS: 25.0000 mg | ORAL_TABLET | Freq: Three times a day (TID) | ORAL | 0 refills | Status: DC | PRN

## 2018-12-07 MED ORDER — PROMETHAZINE HCL 25 MG/ML IJ SOLN: 12.5000 mg | Freq: Four times a day (QID) | INTRAMUSCULAR | Status: DC | PRN

## 2018-12-07 NOTE — Discharge Instructions (Signed)
1)You most likely have Benign Positional Vertigo--- Please see attached instructions  2) take meclizine 25 mg up to 3 times a day as needed to help your dizziness and vertigo symptoms--- 3)Meclizine will make you drowsy and sleepy--- please do NOT drive or operate machinery while taking meclizine 4) please follow-up with your primary care physician for possible MRI of the brain to rule out stroke in the back of the head if your symptoms fail to improve significantly over the next 3 to 5 days 5) please avoid standing up too quickly or turning your head too quickly as this will make you dizzy 6) consider using a cane or holding onto somebody until you feel more unsteady  --- Please read the materials attached with regards to benign positional vertigo and Epley maneuvers

## 2018-12-07 NOTE — Progress Notes (Signed)
IV removed, patient tolerated well.  Reviewed AVS with patient who verbalized understanding.  Patient to be transported home by his wife.  Taken to short stay via wheelchair.

## 2018-12-07 NOTE — H&P (Signed)
TRH H&P    Patient Demographics:    Mike Hall, is a 68 y.o. male  MRN: [id number]  DOB - [birth year]  Admit Date - 12/06/2018  Referring MD/NP/PA: Dr. Roxanne Mins  Outpatient Primary MD for the patient is Yong Channel Brayton Mars, MD  Patient coming from: Home  Chief complaint-dizziness   HPI:    Mike Hall  is a 68 y.o. male, with history of hyperlipidemia came to hospital with complaints of dizziness and vomiting which started last evening.  Patient had a similar episode of dizziness 5 to 6 years ago which lasted for couple of days.  In the ED patient was given meclizine, prochlorperazine, dizziness had subsided.  But patient continued to have nausea and vomiting. He denies slurred speech, blurred vision, focal weakness of extremities Denies earache. Denies chest pain or shortness of breath. Denies abdominal pain or dysuria.    Review of systems:    In addition to the HPI above,   All other systems reviewed and are negative.    Past History of the following :    Past Medical History:  Diagnosis Date  . Hyperlipidemia   . Pre-syncope       Past Surgical History:  Procedure Laterality Date  . none        Social History:      Social History   Tobacco Use  . Smoking status: Never Smoker  . Smokeless tobacco: Never Used  Substance Use Topics  . Alcohol use: No       Family History :     Family History  Problem Relation Age of Onset  . Uterine cancer Mother        66  . Coronary artery disease Father        age 61  . Diabetes Mellitus I Daughter   . Hypertension Brother   . Colon cancer Neg Hx       Home Medications:   Prior to Admission medications   Medication Sig Start Date End Date Taking? Authorizing Provider  cetirizine (ZYRTEC) 10 MG tablet Take 10 mg by mouth daily.    [provider]  cholecalciferol (VITAMIN D3) 25 MCG (1000 UT) tablet Take by mouth daily.     [provider]  DENTA 5000 PLUS 1.1 % CREA dental cream USE TO BRUSH TEETH AT BEDTIME, SPIT OUT, THEN FLOSS. DO NOT EAT, DRINK, OR RINSE FOR 30 MINUTES 11/15/17   [provider]  Flaxseed, Linseed, (FLAXSEED OIL PO) Take 1,000 mg by mouth. 3450 mg    [provider]  Magnesium Gluconate (MAGONATE PO) Take 1,200 mg by mouth.     [provider]     Allergies:    No Known Allergies   Physical Exam:   Vitals  Blood pressure (!) 129/92, pulse 77, temperature (!) 97.5 F (36.4 C), temperature source Axillary, resp. rate 20, height 5\' 10"  (1.778 m), weight 83.9 kg, SpO2 98 %.  1.  General: Appears in moderate distress  2. Psychiatric: Alert, oriented x3, intact insight and judgment  3. Neurologic:  Cranial nerves II through XII grossly intact, motor strength 5/5 in all extremities  4. HEENMT:  Atraumatic normocephalic, extraocular muscles intact, ear examination reveals wax buildup bilaterally  5. Respiratory : Clear to auscultation bilaterally, no wheezing or crackles  6. Cardiovascular : S1-S2, regular, no murmur auscultated  7. Gastrointestinal:  Abdomen is soft nontender, no organomegaly     Data Review:    CBC Recent Labs  Lab 12/07/18 0247  WBC 9.4  HGB 13.2  HCT 38.8*  PLT 165  MCV 87.6  MCH 29.8  MCHC 34.0  RDW 12.6  LYMPHSABS 0.7  MONOABS 0.3  EOSABS 0.0  BASOSABS 0.0   ------------------------------------------------------------------------------------------------------------------  Results for orders placed or performed during the hospital encounter of 12/06/18 (from the past 48 hour(s))  Basic metabolic panel     Status: Abnormal   Collection Time: 12/07/18  2:47 AM  Result Value Ref Range   Sodium 138 135 - 145 mmol/L   Potassium 4.3 3.5 - 5.1 mmol/L   Chloride 104 98 - 111 mmol/L   CO2 24 22 - 32 mmol/L   Glucose, Bld 147 (H) 70 - 99 mg/dL   BUN 16 8 - 23 mg/dL   Creatinine, Ser 0.88 0.61 - 1.24 mg/dL    Calcium 8.7 (L) 8.9 - 10.3 mg/dL   GFR calc non Af Amer >60 >60 mL/min   GFR calc Af Amer >60 >60 mL/min   Anion gap 10 5 - 15    Comment: Performed at [facility], [address] [place], [place] [address]  CBC with Differential     Status: Abnormal   Collection Time: 12/07/18  2:47 AM  Result Value Ref Range   WBC 9.4 4.0 - 10.5 K/uL   RBC 4.43 4.22 - 5.81 MIL/uL   Hemoglobin 13.2 13.0 - 17.0 g/dL   HCT 38.8 (L) 39.0 - 52.0 %   MCV 87.6 80.0 - 100.0 fL   MCH 29.8 26.0 - 34.0 pg   MCHC 34.0 30.0 - 36.0 g/dL   RDW 12.6 11.5 - 15.5 %   Platelets 165 150 - 400 K/uL   nRBC 0.0 0.0 - 0.2 %   Neutrophils Relative % 89 %   Neutro Abs 8.4 (H) 1.7 - 7.7 K/uL   Lymphocytes Relative 7 %   Lymphs Abs 0.7 0.7 - 4.0 K/uL   Monocytes Relative 4 %   Monocytes Absolute 0.3 0.1 - 1.0 K/uL   Eosinophils Relative 0 %   Eosinophils Absolute 0.0 0.0 - 0.5 K/uL   Basophils Relative 0 %   Basophils Absolute 0.0 0.0 - 0.1 K/uL   Immature Granulocytes 0 %   Abs Immature Granulocytes 0.02 0.00 - 0.07 K/uL    Comment: Performed at [facility], [address] [place], [place] [address]    Chemistries  Recent Labs  Lab 12/07/18 0247  NA 138  K 4.3  CL 104  CO2 24  GLUCOSE 147*  BUN 16  CREATININE 0.88  CALCIUM 8.7*    --------------------------------------------------------------------------------------------------------------- Urine analysis:    Component Value Date/Time   BILIRUBINUR n 09/18/2015 1014   PROTEINUR n 09/18/2015 1014   UROBILINOGEN 0.2 09/18/2015 1014   NITRITE n 09/18/2015 1014   LEUKOCYTESUR Negative 09/18/2015 1014      Imaging Results:    No results found.  My personal review of EKG: Rhythm NSR   Assessment & Plan:    Active Problems:   Dizziness   1. Dizziness-likely BPPV, as patient has persistent symptoms despite  treating with meclizine, Phenergan.  Will obtain MRI brain in a.m.  Continue with meclizine 25 mg 3 times daily as needed, Phenergan 12.5  mg IV every 6 hours as needed.   DVT Prophylaxis-   Lovenox   AM Labs Ordered, also please review Full Orders  Family Communication: Admission, patients condition and plan of care including tests being ordered have been discussed with the patient  who indicate understanding and agree with the plan and Code Status.  Code Status: Full code  Admission status: Observation-  Based on patients clinical presentation and evaluation of above clinical data, I have made determination that patient will need less than 2 midnight stay in the hospital.  Time spent in minutes : 60 minutes   Oswald Hillock M.D on 12/07/2018 at 4:52 AM

## 2018-12-07 NOTE — ED Provider Notes (Signed)
[facility] EMERGENCY DEPARTMENT Provider Note   CSN: [id number] Arrival date & time: 12/06/18  2305   History   Chief Complaint Chief Complaint  Patient presents with   Dizziness    HPI Mike Hall is a 68 y.o. male.   The history is provided by the patient.  He has history of hyperlipidemia and comes in because of dizziness and vomiting which started about 7 PM.  Dizziness is described as the room spinning.  The spinning sensation has subsided, but he continues to have nausea and vomiting.  Symptoms are worse with movement, better if he lays still.  He was given normal saline and ondansetron in the ambulance, but continued to have vomiting.  He has been given a dose of prochlorperazine in the ED without any improvement in nausea.  He did have a similar episode several years ago which was diagnosed as vertigo.  He denies headache, ear pain, loss of hearing, tinnitus.  Past Medical History:  Diagnosis Date   Hyperlipidemia    Pre-syncope     Patient Active Problem List   Diagnosis Date Noted   Vitamin D deficiency 12/02/2017   Constipation 09/25/2015   History of adenomatous polyp of colon 09/18/2014   Hyperlipidemia 12/20/2012    Past Surgical History:  Procedure Laterality Date   none          Home Medications    Prior to Admission medications   Medication Sig Start Date End Date Taking? Authorizing Provider  cetirizine (ZYRTEC) 10 MG tablet Take 10 mg by mouth daily.    [provider]  cholecalciferol (VITAMIN D3) 25 MCG (1000 UT) tablet Take by mouth daily.    [provider]  DENTA 5000 PLUS 1.1 % CREA dental cream USE TO BRUSH TEETH AT BEDTIME, SPIT OUT, THEN FLOSS. DO NOT EAT, DRINK, OR RINSE FOR 30 MINUTES 11/15/17   [provider]  Flaxseed, Linseed, (FLAXSEED OIL PO) Take 1,000 mg by mouth. 3450 mg    [provider]  Magnesium Gluconate (MAGONATE PO) Take 1,200 mg by mouth.     [provider]     Family History Family History  Problem Relation Age of Onset   Uterine cancer Mother        66   Coronary artery disease Father        age 68   Diabetes Mellitus I Daughter    Hypertension Brother    Colon cancer Neg Hx     Social History Social History   Tobacco Use   Smoking status: Never Smoker   Smokeless tobacco: Never Used  Substance Use Topics   Alcohol use: No   Drug use: No     Allergies   Patient has no known allergies.   Review of Systems Review of Systems  All other systems reviewed and are negative.    Physical Exam Updated Vital Signs BP (!) 131/92    Pulse 80    Temp (!) 97.5 F (36.4 C) (Axillary)    Resp 18    Ht 5\' 10"  (1.778 m)    Wt 83.9 kg    SpO2 99%    BMI 26.54 kg/m   Physical Exam Vitals signs and nursing note reviewed.    68 year old male, resting comfortably and in no acute distress. Vital signs are significant for elevated blood pressure. Oxygen saturation is 99%, which is normal. Head is normocephalic and atraumatic. PERRLA, EOMI. Oropharynx is clear.  Tympanic membranes are clear. Neck  is nontender and supple without adenopathy or JVD. Back is nontender and there is no CVA tenderness. Lungs are clear without rales, wheezes, or rhonchi. Chest is nontender. Heart has regular rate and rhythm without murmur. Abdomen is soft, flat, nontender without masses or hepatosplenomegaly and peristalsis is normoactive. Extremities have no cyanosis or edema, full range of motion is present. Skin is warm and dry without rash. Neurologic: Mental status is normal, cranial nerves are intact, there are no motor or sensory deficits.  There is no nystagmus.  Dizziness and nausea are reproduced by passive head movement.  ED Treatments / Results  Labs (all labs ordered are listed, but only abnormal results are displayed) Labs Reviewed  BASIC METABOLIC PANEL - Abnormal; Notable for the following components:      Result Value    Glucose, Bld 147 (*)    Calcium 8.7 (*)    All other components within normal limits  CBC WITH DIFFERENTIAL/PLATELET - Abnormal; Notable for the following components:   HCT 38.8 (*)    Neutro Abs 8.4 (*)    All other components within normal limits  NOVEL CORONAVIRUS, NAA (HOSPITAL ORDER, SEND-OUT TO REF LAB)    EKG EKG Interpretation  Date/Time:  Tuesday December 07 2018 02:04:22 EDT Ventricular Rate:  79 PR Interval:    QRS Duration: 91 QT Interval:  381 QTC Calculation: 437 R Axis:   59 Text Interpretation:  Sinus rhythm Abnormal R-wave progression, early transition No old tracing to compare Confirmed by Delora Fuel ([id number]) on 12/07/2018 2:07:01 AM   Procedures Procedures   Medications Ordered in ED Medications  prochlorperazine (COMPAZINE) injection 10 mg (10 mg Intravenous Given 12/07/18 0136)     Initial Impression / Assessment and Plan / ED Course  I have reviewed the triage vital signs and the nursing notes.  Pertinent labs & imaging results that were available during my care of the patient were reviewed by me and considered in my medical decision making (see chart for details).  Dizziness which does appear to be peripheral vertigo.  No red flags to suggest central vertigo.  Old records reviewed, and he has no relevant past visits.  Will check ECG and if QT interval is normal, give him a trial of droperidol.  ECG shows normal QTc interval.  He will be given a dose of droperidol as well as oral meclizine.  Unfortunately, droperidol is on backorder.  He noticed minimal improvement with meclizine.  When he sits up, he still gets dizzy and nauseous.  He is not stable to go home.  He will need MRI to rule out posterior circulation stroke.  Case is discussed with Dr. Darrick Meigs of [facility], who agrees to admit the patient.  Final Clinical Impressions(s) / ED Diagnoses   Final diagnoses:  Dizziness    ED Discharge Orders    None       Delora Fuel, MD 85/27/78  [phone]

## 2018-12-07 NOTE — Telephone Encounter (Signed)
Pt currently admitted at [facility]. Forwarding to Dr. Yong Channel as Juluis Rainier.

## 2018-12-07 NOTE — Discharge Summary (Signed)
Mike Hall, is a 68 y.o. male  DOB 1951/02/20  MRN [id number].  Admission date:  12/06/2018  Admitting Physician  Oswald Hillock, MD  Discharge Date:  12/07/2018   Primary MD  Marin Olp, MD  Recommendations for primary care physician for things to follow:   1)You most likely have Benign Positional Vertigo--- Please see attached instructions  2) take meclizine 25 mg up to 3 times a day as needed to help your dizziness and vertigo symptoms--- 3)Meclizine will make you drowsy and sleepy--- please do NOT drive or operate machinery while taking meclizine 4) please follow-up with your primary care physician for possible MRI of the brain to rule out stroke in the back of the head if your symptoms fail to improve significantly over the next 3 to 5 days 5) please avoid standing up too quickly or turning your head too quickly as this will make you dizzy 6) consider using a cane or holding onto somebody until you feel more unsteady  --- Please read the materials attached with regards to benign positional vertigo and Epley maneuvers   Admission Diagnosis  Dizziness [R42]   Discharge Diagnosis  Dizziness [R42]   Active Problems:   Dizziness      Past Medical History:  Diagnosis Date  . Hyperlipidemia   . Pre-syncope     Past Surgical History:  Procedure Laterality Date  . none         HPI  from the history and physical done on the day of admission:    Mike Hall  is a 68 y.o. male, with history of hyperlipidemia came to hospital with complaints of dizziness and vomiting which started last evening.  Patient had a similar episode of dizziness 5 to 6 years ago which lasted for couple of days.  In the ED patient was given meclizine, prochlorperazine, dizziness had subsided.  But patient continued to have nausea and vomiting. He denies slurred speech, blurred vision, focal weakness of extremities  Denies earache. Denies chest pain or shortness of breath. Denies abdominal pain or dysuria.  1)Dizziness--- most likely BPV related, exam is reassuring at this time, patient refuses to get brain MRI due to significant concerns about claustrophobia.  He has no new focal deficits and gait is steady.  Coordination test within normal limits.   --If symptoms persist patient follow-up with PCP for outpatient open MRI   Patient notes that patient apparently had similar symptoms 5 years ago he was treated at [facility] in ED with meclizine he took him about 3 to 5 days to recover at that time    Hospital Course:    1)Dizziness--- most likely BPV related, exam is reassuring at this time, patient refuses to get brain MRI due to significant concerns about claustrophobia.  I offered to premedicate patient for brain MRI --patient adamantly refuses . he has no new focal deficits and gait is steady.  Coordination test within normal limits.   --If symptoms persist patient follow-up with PCP for outpatient open  MRI   Patient notes that patient apparently had similar symptoms 5 years ago he was treated at [facility] in ED with meclizine he took him about 3 to 5 days to recover at that time   Discharge Condition:  stable  Follow UP--PCP  Follow-up Information    Marin Olp, MD Follow up on 12/09/2018.   Specialty: Family Medicine Why: Thursday at 3:40 with Dr Jerline Pain.  This is a virtual appointment.  The office will call you about 15 minutes prior to the appointment.  Contact information: [place] [address] [phone]          Diet and Activity recommendation:  As advised  Discharge Instructions    Discharge Instructions    Call MD for:  difficulty breathing, headache or visual disturbances   Complete by: As directed    Call MD for:  persistant dizziness or light-headedness   Complete by: As directed    Call MD for:  persistant nausea and vomiting    Complete by: As directed    Call MD for:  severe uncontrolled pain   Complete by: As directed    Call MD for:  temperature >100.4   Complete by: As directed    Diet - low sodium heart healthy   Complete by: As directed    Discharge instructions   Complete by: As directed    1)You most likely have Benign Positional Vertigo--- Please see attached instructions  2) take meclizine 25 mg up to 3 times a day as needed to help your dizziness and vertigo symptoms--- 3)Meclizine will make you drowsy and sleepy--- please do NOT drive or operate machinery while taking meclizine 4) please follow-up with your primary care physician for possible MRI of the brain to rule out stroke in the back of the head if your symptoms fail to improve significantly over the next 3 to 5 days 5) please avoid standing up too quickly or turning your head too quickly as this will make you dizzy 6) consider using a cane or holding onto somebody until you feel more unsteady   Increase activity slowly   Complete by: As directed    Fall precautions-- consider using a cane or holding onto somebody until you feel more unsteady        Discharge Medications     Allergies as of 12/07/2018   No Known Allergies     Medication List    TAKE these medications   acetaminophen 325 MG tablet Commonly known as: TYLENOL Take 2 tablets (650 mg total) by mouth every 6 (six) hours as needed for mild pain, fever or headache (or Fever >/= 101).   cetirizine 10 MG tablet Commonly known as: ZYRTEC Take 10 mg by mouth daily.   cholecalciferol 25 MCG (1000 UT) tablet Commonly known as: VITAMIN D3 Take by mouth daily.   Denta 5000 Plus 1.1 % Crea dental cream Generic drug: sodium fluoride USE TO BRUSH TEETH AT BEDTIME, SPIT OUT, THEN FLOSS. DO NOT EAT, DRINK, OR RINSE FOR 30 MINUTES   FLAXSEED OIL PO Take 1,000 mg by mouth. 3450 mg   MAGONATE PO Take 1,200 mg by mouth.   meclizine 25 MG tablet Commonly known as: ANTIVERT  Take 1 tablet (25 mg total) by mouth 3 (three) times daily as needed for dizziness.   ondansetron 4 MG disintegrating tablet Commonly known as: Zofran ODT Take 1 tablet (4 mg total) by mouth every 6 (six) hours as needed for nausea or  vomiting.      Major procedures and Radiology Reports - PLEASE review detailed and final reports for all details, in brief -   Today   Subjective  Mike Hall today has no new complaints  Drinking okay, no emesis,          Patient has been seen and examined prior to discharge   Objective   Blood pressure (!) 145/97, pulse 83, temperature 98 F (36.7 C), temperature source Oral, resp. rate 17, height 5\' 10"  (1.778 m), weight 83.9 kg, SpO2 100 %.   Intake/Output Summary (Last 24 hours) at 12/07/2018 1315 Last data filed at 12/07/2018 1233 Gross per 24 hour  Intake 503.98 ml  Output -  Net 503.98 ml    Exam Gen:- Awake Alert, no acute distress  HEENT:- [place].AT, No sclera icterus Neck-Supple Neck,No JVD,.  Lungs-  CTAB , good air movement bilaterally  CV- S1, S2 normal, regular Abd-  +ve B.Sounds, Abd Soft, No tenderness,    Extremity/Skin:- No  edema,   good pulses Psych-affect is appropriate, oriented x3 Neuro-no new focal deficits, no tremors , coordination test, heel-to-shin, finger-to-nose and tandem walking within normal limits at this time   Data Review   CBC w Diff:  Lab Results  Component Value Date   WBC 9.4 12/07/2018   HGB 13.2 12/07/2018   HCT 38.8 (L) 12/07/2018   PLT 165 12/07/2018   LYMPHOPCT 7 12/07/2018   MONOPCT 4 12/07/2018   EOSPCT 0 12/07/2018   BASOPCT 0 12/07/2018    CMP:  Lab Results  Component Value Date   NA 138 12/07/2018   NA 140 09/26/2014   K 4.3 12/07/2018   CL 104 12/07/2018   CO2 24 12/07/2018   BUN 16 12/07/2018   BUN 13 09/26/2014   CREATININE 0.88 12/07/2018   CREATININE 0.90 12/20/2012   GLU 101 09/26/2014   PROT 6.8 08/23/2018   PROT 7.0 05/16/2013   ALBUMIN 4.5 08/23/2018    ALBUMIN 4.6 05/16/2013   BILITOT 0.7 08/23/2018   ALKPHOS 56 08/23/2018   AST 19 08/23/2018   ALT 18 08/23/2018  .   Total Discharge time is about 33 minutes  Roxan Hockey M.D on 12/07/2018 at 1:15 PM  Go to [email or web address] -  for contact info  [facility] - Office  [phone]

## 2018-12-07 NOTE — Telephone Encounter (Signed)
[facility] [place] RECORD [facility]urse Patient Name: DREXEL IVEY Gender: Male DOB: [birth year] Age: 68 Y 11 M 16 D Return Phone Number: [phone] (Primary), [phone] (Secondary) Address[place] [address] Client [facility] Client Site [place] [facility] [place] Night Physician Garret Reddish- MD Contact Type Call Who Is Calling Patient / Member / Family / Caregiver Call Type Triage / Clinical Caller Name Zyrell Carmean Relationship To Patient Spouse Return Phone Number [phone] (Primary) Chief Complaint Dizziness Reason for Call Symptomatic / Request for [place] states her husband has vertigo. Caller states he has gotten dizzy and has given him 2 meclizine. Caller states about 15 minutes after taking it, he started vomiting. Caller states he has urinated recently and no blood in vomit. Translation No Nurse Assessment Nurse: Turner-Bodine, RN, Freda Munro Date/Time (Eastern Time): 12/06/2018 9:07:50 PM Confirm and document reason for call. If symptomatic, describe symptoms. ---Caller states her husband has vertigo. Caller states he has gotten dizzy and has given him 2 meclizine onset 7 pm. Caller states about 15 minutes after taking it, he started vomiting x3. Caller states he has urinated recently and no blood in vomit. Has the patient had close contact with a person known or suspected to have the novel coronavirus illness OR traveled / lives in area with major community spread (including international travel) in the last 14 days from the onset of symptoms? * If Asymptomatic, screen for exposure and travel within the last 14 days. ---No Does the patient have any new or worsening symptoms? ---Yes Will a triage be completed? ---Yes Related visit to physician within the last 2 weeks? ---No Does the PT have any chronic  conditions? (i.e. diabetes, asthma, this includes High risk factors for pregnancy, etc.) ---Yes List chronic conditions. ---vertigo in 2014 but not had many other episodes Is this a behavioral health or substance abuse call? ---No PLEASE NOTE: All timestamps contained within this report are represented as [place] Standard Time. CONFIDENTIALTY NOTICE: This fax transmission is intended only for the addressee. It contains information that is legally privileged, confidential or otherwise protected from use or disclosure. If you are not the intended recipient, you are strictly prohibited from reviewing, disclosing, copying using or disseminating any of this information or taking any action in reliance on or regarding this information. If you have received this fax in error, please notify us immediately by telephone so that we can arrange for its return to [place]. Phone: [phone], Toll-Free: [phone], Fax: [phone] Page: 2 of 2 Call Id: [id number] Guidelines Guideline Title Affirmed Question Affirmed Notes Nurse Date/Time Eilene Ghazi Time) Dizziness - Vertigo [1] Dizziness (vertigo) present now AND [2] age > 70 (Exception: prior physician evaluation for this AND no different/ worse than usual) Turner-Bodine, RN, Freda Munro 12/06/2018 9:11:12 PM Disp. Time Eilene Ghazi Time) Disposition Final User 12/06/2018 8:56:59 PM Attempt made - line busy Turner-Bodine, RN, Freda Munro 12/06/2018 9:15:26 PM Go to ED Now (or PCP triage) Yes Athena Masse, RN, Ayesha Mohair Disagree/Comply Comply Caller Understands Yes PreDisposition InappropriateToAsk Care Advice Given Per Guideline GO TO ED NOW (OR PCP TRIAGE): * IF NO PCP (PRIMARY CARE PROVIDER) SECOND-LEVEL TRIAGE: You need to be seen within the next hour. Go to the [facility] at _____________ [place] as soon as you can. NOTE TO TRIAGER - DRIVING: * Another adult should drive. * If immediate transportation is not available via car or taxi, then the patient  should be instructed to call EMS-[id number]. CARE ADVICE given per Dizziness - Vertigo (Adult) guideline. Comments User: Terrall Laity, RN Date/Time Eilene Ghazi Time): 12/06/2018 9:15:20 PM Caller stated that patient is vomiting numerous times and saying he is so dizzy and has not had a vertigo episode like this since 2014. she states she gave him meclizine but he vomited them up. Referrals GO TO FACILITY UNDECIDED GO TO FACILITY UNDECIDED   PER [facility]

## 2018-12-08 LAB — NOVEL CORONAVIRUS, NAA (HOSP ORDER, SEND-OUT TO REF LAB; TAT 18-24 HRS): SARS-CoV-2, NAA: NOT DETECTED

## 2018-12-09 ENCOUNTER — Encounter: Payer: Self-pay | Admitting: Family Medicine

## 2018-12-09 ENCOUNTER — Ambulatory Visit (INDEPENDENT_AMBULATORY_CARE_PROVIDER_SITE_OTHER): Payer: Medicare HMO | Admitting: Family Medicine

## 2018-12-09 DIAGNOSIS — R42 Dizziness and giddiness: Secondary | ICD-10-CM

## 2018-12-09 DIAGNOSIS — H81399 Other peripheral vertigo, unspecified ear: Secondary | ICD-10-CM

## 2018-12-09 LAB — HIV ANTIBODY (ROUTINE TESTING W REFLEX): HIV Screen 4th Generation wRfx: NONREACTIVE

## 2018-12-09 NOTE — Progress Notes (Signed)
Chief Complaint:  Mike Hall is a 68 y.o. male who presents today for a TCM visit.  Assessment/Plan:  Vertigo Seems to be improving.  Consistent with BPPV.  However given severe tach, and reasonable to check MRI to rule out other potential causes.  He has severe claustrophobia and requires open MRI.  Will place referral to have this testing done.  Also place referral to vestibular rehab.  Recommended that he continue taking Zofran as needed for nausea.  Discussed reasons to return to care and seek emergent care.   Patient has a moderate level of medical complexity.    Subjective:  HPI:  Summary of Hospital admission: Reason for admission: Dizziness Date of admission: 12/07/2018 Date of discharge: 12/07/2018 Date of Interactive contact: N/A - today is within 2 business days Summary of Hospital course: Patient presented to the ED with severe dizziness and vomiting.  In the ED he was given a cocktail of meclizine and compazine however continued to have nausea and vomiting and he was admitted for observation.  Symptoms were thought to be partially due to BPPV.  By hospital day 0, he had some resolution of his nausea and vomiting was discharged home.  Interim history outlined by problem:   # Vertigo Symptoms have gradually improved over the last couple days since being discharged.  Still has a little vertigo.  Still some nausea but that seems to be at his baseline.  He has not taken any meclizine for the past couple of days due to some flushing episodes.  He is also had a few episodes of intermittent palpitations with tremors.  The symptoms also seem to be improving.  No weakness or numbness.  No headaches.  No other obvious alleviating or aggravating factors.   ROS: Per HPI, otherwise a complete review of systems was negative.   PMH:  The following were reviewed and entered/updated in epic: Past Medical History:  Diagnosis Date  . Hyperlipidemia   . Pre-syncope    Patient Active  Problem List   Diagnosis Date Noted  . Dizziness 12/07/2018  . Vitamin D deficiency 12/02/2017  . Constipation 09/25/2015  . History of adenomatous polyp of colon 09/18/2014  . Hyperlipidemia 12/20/2012   Past Surgical History:  Procedure Laterality Date  . none      Family History  Problem Relation Age of Onset  . Uterine cancer Mother        71  . Coronary artery disease Father        age 12  . Diabetes Mellitus I Daughter   . Hypertension Brother   . Colon cancer Neg Hx    Medications- Reconciled discharge and current medications in Epic.  Current Outpatient Medications  Medication Sig Dispense Refill  . cholecalciferol (VITAMIN D3) 25 MCG (1000 UT) tablet Take by mouth daily.    . DENTA 5000 PLUS 1.1 % CREA dental cream USE TO BRUSH TEETH AT BEDTIME, SPIT OUT, THEN FLOSS. DO NOT EAT, DRINK, OR RINSE FOR 30 MINUTES  11  . Flaxseed, Linseed, (FLAXSEED OIL PO) Take 1,000 mg by mouth. 3450 mg    . Magnesium Gluconate (MAGONATE PO) Take 1,200 mg by mouth.     . meclizine (ANTIVERT) 25 MG tablet Take 1 tablet (25 mg total) by mouth 3 (three) times daily as needed for dizziness. (Patient not taking: Reported on 12/09/2018) 30 tablet 0  . ondansetron (ZOFRAN ODT) 4 MG disintegrating tablet Take 1 tablet (4 mg total) by mouth every 6 (six)  hours as needed for nausea or vomiting. (Patient not taking: Reported on 12/09/2018) 20 tablet 0   No current facility-administered medications for this visit.     Allergies-reviewed and updated No Known Allergies  Social History   Socioeconomic History  . Marital status: Married    Spouse name: Not on file  . Number of children: Not on file  . Years of education: Not on file  . Highest education level: Not on file  Occupational History  . Not on file  Social Needs  . Financial resource strain: Not on file  . Food insecurity    Worry: Not on file    Inability: Not on file  . Transportation needs    Medical: Not on file     Non-medical: Not on file  Tobacco Use  . Smoking status: Never Smoker  . Smokeless tobacco: Never Used  Substance and Sexual Activity  . Alcohol use: No  . Drug use: No  . Sexual activity: Not on file  Lifestyle  . Physical activity    Days per week: Not on file    Minutes per session: Not on file  . Stress: Not on file  Relationships  . Social Herbalist on phone: Not on file    Gets together: Not on file    Attends religious service: Not on file    Active member of club or organization: Not on file    Attends meetings of clubs or organizations: Not on file    Relationship status: Not on file  Other Topics Concern  . Not on file  Social History Narrative   Married. 1 daughter. No grandkids.    GED      Works in [employer] in [place]. 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       Hobbies: time at home, yardwork        Objective:  Physical Exam: Gen: NAD, resting comfortably Eyes: EOMI, No scleral icterus CV: No peripheral edema Pulm: NWOB, speaking in full sentences MSK: No digital cyanosis.  Full range motion throughout. Skin: No rashes. Neuro: Grossly normal, moves all extremities Psych: Normal affect and thought content  I connected with Rowland Lathe on 12/09/18 at  3:40 PM EDT by a video enabled telemedicine application and verified that I am speaking with the correct person using two identifiers. I discussed the limitations of evaluation and management by telemedicine and the availability of in person appointments. The patient expressed understanding and agreed to proceed.   Patient location: Home Provider location: [place] participating in the virtual visit: Myself and Patient      Algis Greenhouse. Jerline Pain, MD 12/09/2018 4:12 PM

## 2018-12-12 ENCOUNTER — Encounter: Payer: Self-pay | Admitting: Family Medicine

## 2018-12-14 ENCOUNTER — Telehealth: Payer: Self-pay | Admitting: Physical Therapy

## 2018-12-14 NOTE — Telephone Encounter (Signed)
Copied from [place] [id number]. Topic: General - Inquiry >> Dec 14, 2018  2:29 PM Rainey Pines A wrote: Patients wife would like callback from referral coordinator in regards to patients referral.

## 2018-12-15 ENCOUNTER — Encounter: Payer: Self-pay | Admitting: Rehabilitative and Restorative Service Providers"

## 2018-12-15 ENCOUNTER — Ambulatory Visit: Payer: Medicare HMO | Attending: Family Medicine | Admitting: Rehabilitative and Restorative Service Providers"

## 2018-12-15 ENCOUNTER — Other Ambulatory Visit: Payer: Self-pay

## 2018-12-15 DIAGNOSIS — R2689 Other abnormalities of gait and mobility: Secondary | ICD-10-CM | POA: Diagnosis not present

## 2018-12-15 DIAGNOSIS — R42 Dizziness and giddiness: Secondary | ICD-10-CM | POA: Diagnosis not present

## 2018-12-15 NOTE — Therapy (Signed)
[facility] [address] [place], [place], [address] Phone: [phone]   Fax:  [phone]  Physical Therapy Evaluation  Patient Details  Name: Mike Hall MRN: [id number] Date of Birth: 1950/11/28 Referring Provider (PT): Dimas Chyle, MD  CLINIC OPERATION CHANGES: Outpatient Neuro Rehab is open at lower capacity following universal masking, social distancing, and patient screening.  The patient's COVID risk of complications score is 2.  Encounter Date: 12/15/2018  PT End of Session - 12/15/18 1617    Visit Number  1    Number of Visits  8    Date for PT Re-Evaluation  02/13/19    Authorization Type  aetna medicare    PT Start Time  1004    PT Stop Time  1050    PT Time Calculation (min)  46 min    Activity Tolerance  Patient tolerated treatment well    Behavior During Therapy  WFL for tasks assessed/performed       Past Medical History:  Diagnosis Date  . Hyperlipidemia   . Pre-syncope     Past Surgical History:  Procedure Laterality Date  . none      There were no vitals filed for this visit.   Subjective Assessment - 12/15/18 1001    Subjective  The patient notes on 12/07/2018 he had a sudden onset of room spinning vertigo.  He had n/v x 1 hour and went to the ED.  He was given meclizine and phenergan to stop acute vertigo.  It took a couple of days to get up and moving due to feeling overmedicated.  He currently denies true spinning, but reports "my eyes and head won't get together."  He is noticing difficulty in the car when looking to the side.    Pertinent History  h/o BPPV in 2013    Patient Stated Goals  Get the eyes and head back to normal.    Currently in Pain?  No/denies         [facility] PT Assessment - 12/15/18 1009      Assessment   Medical Diagnosis  Vertigo    Referring Provider (PT)  Dimas Chyle, MD    Onset Date/Surgical Date  12/07/18    Prior Therapy  none      Precautions    Precautions  Fall      Restrictions   Weight Bearing Restrictions  No      Balance Screen   Has the patient fallen in the past 6 months  No    Has the patient had a decrease in activity level because of a fear of falling?   Yes   temporarily due to vertigo   Is the patient reluctant to leave their home because of a fear of falling?   No      Home Film/video editor residence    Living Arrangements  Spouse/significant other    Home Layout  Two level      Prior Function   Level of [place]  Retired      Data processing manager    Results  Patient scored 66% compared to age/height normative values of 68%.  He scored WNLs condition 1, scored mildly below normal on trial 1 of conditions 2, 3, and then [facility]  on the 2nd and 3rd trials.  He had sigificantly dec'd score on trial 1 of condition 4, then WNLs for trials 2-3.  He scored below normal trial 1-2 of condition 5 and below normal on trial 3 of condition 6.  He is WNLs for somatosensory use, dec'd use of visual and vestibular feedback for balance.             Vestibular Assessment - 12/15/18 1010      Vestibular Assessment   General Observation  Patient walks into clinic independently.  He notes he initally had slowed gait, but this has improved.  Patient notes history of 2 months ago, had ear wax and cleaned it.  He gets times that the wax buildup hinders his hearing.  He reports hearing is back to baseline after cleaning.      Symptom Behavior   Subjective history of current problem  Sudden onset    Type of Dizziness   Spinning;Unsteady with head/body turns   head and eyes don't move together   Frequency of Dizziness  daily    Duration of Dizziness  seconds    Symptom Nature  Motion provoked    Aggravating Factors  Turning head  quickly;Turning body quickly    Relieving Factors  Head stationary    Progression of Symptoms  Better    History of similar episodes  In 2013      Oculomotor Exam   Oculomotor Alignment  Normal    Ocular ROM  WFLs    Spontaneous  Absent    Gaze-induced   Absent    Head shaking Horizontal  L beating nystagmus    Smooth Pursuits  Intact    Saccades  Intact      Vestibulo-Ocular Reflex   Comment  Head impulse test= + to the right side for refixation saccade indicating R vestibular hypofunction.        Visual Acuity   Static  Line 7    Dynamic  Line 2      Positional Testing   Dix-Hallpike  Dix-Hallpike Right;Dix-Hallpike Left    Horizontal Canal Testing  Horizontal Canal Right;Horizontal Canal Left      Dix-Hallpike Right   Dix-Hallpike Right Duration  none    Dix-Hallpike Right Symptoms  No nystagmus      Dix-Hallpike Left   Dix-Hallpike Left Duration  none    Dix-Hallpike Left Symptoms  No nystagmus      Horizontal Canal Right   Horizontal Canal Right Duration  none    Horizontal Canal Right Symptoms  Normal      Horizontal Canal Left   Horizontal Canal Left Duration  none    Horizontal Canal Left Symptoms  Normal      Positional Sensitivities   Right Hallpike  No dizziness    Up from Right Hallpike  No dizziness    Up from Left Hallpike  No dizziness    Head Turning x 5  Lightheadedness    Head Nodding x 5  No dizziness    Rolling Right  No dizziness    Rolling Left  No dizziness          Objective measurements completed on examination: See above findings.       Vestibular Treatment/Exercise - 12/15/18 1037      Vestibular Treatment/Exercise   Vestibular Treatment Provided  Gaze    Gaze Exercises  X1 Viewing Horizontal;X1 Viewing Vertical      X1 Viewing Horizontal   Foot Position  standing feet  apart    Comments  30 seconds with visual blurring.      X1 Viewing Vertical   Foot Position  standing feet apart    Comments  30 seconds             PT Education - 12/15/18 1617    Education Details  HEP established    Person(s) Educated  Patient    Methods  Explanation;Demonstration;Handout    Comprehension  Returned demonstration;Verbalized understanding       PT Short Term Goals - 12/15/18 1621      PT SHORT TERM GOAL #1   Title  The patient will be indep with HEP for gaze adaptation, multi-sensory balance use.    Time  4    Period  Weeks    Target Date  01/14/19      PT SHORT TERM GOAL #2   Title  The patient will tolerate gaze x 1 viewing at self selected pace without c/o visual blurring or dizziness x 45 seconds.    Time  4    Period  Weeks    Target Date  01/14/19      PT SHORT TERM GOAL #3   Title  The patient will improve SOT to score WNLs use of visual and vestibular feedback for balance.    Time  4    Period  Weeks    Target Date  01/14/19      PT SHORT TERM GOAL #4   Title  The patient will be further assessed on FGA and goal to follow.    Time  4    Period  Weeks    Target Date  01/14/19        PT Long Term Goals - 12/15/18 1623      PT LONG TERM GOAL #1   Title  The patient will return demo HEP for post d/c progression.    Time  8    Period  Weeks    Target Date  02/13/19      PT LONG TERM GOAL #2   Title  The patient will improve FGA by 4 points from established baseline.    Time  8    Period  Weeks    Target Date  02/13/19      PT LONG TERM GOAL #3   Title  The patient will tolerate gaze x 1 viewing x 2 minutes without c/o dizziness or visual blurring.    Time  8    Period  Weeks    Target Date  02/13/19      PT LONG TERM GOAL #4   Title  The patient will reduce SVA vs DVA from 5 line to 4 line difference demo'ing improved VOR.    Time  8    Period  Weeks    Target Date  02/13/19             Plan - 12/15/18 1625    Clinical Impression Statement  The patient is a 68 yo male presenting to OP physical therapy today with onset of dizziness 12/07/2018.  He continues  with a L beating nystagmus provoked via a head shaking test with frenzel lenses donned, 5 line difference on SVA versus DVA, and a positive R head impulse test.  He is negative today for positional testing.  His current clinical assessment is most consistent with a R unilateral vestibular hypofunction.  He has dec'd use of vestibular and visual cues for balance per sensory organization testing.  Oculomotor exam and gait on level surfaces WNLs at this time.  PT to progress gaze adaptation to improve overall functional status with return to driving and IADLs.    Stability/Clinical Decision Making  Stable/Uncomplicated    Clinical Decision Making  Low    Rehab Potential  Excellent    PT Frequency  1x / week    PT Duration  8 weeks    PT Treatment/Interventions  ADLs/Self Care Home Management;Neuromuscular re-education;Vestibular;Manual techniques;Patient/family education;Balance training;Therapeutic exercise;Therapeutic activities;Functional mobility training;Stair training;Gait training    PT Next Visit Plan  Check HEP, progress gaze adaptation exercises.  CHECK FGA.  Provide further HEP for dynamic gait, high level/multi-sensory balance challenges.    PT Home Exercise Plan  Access Code: [email or web address]    Consulted and Agree with Plan of Care  Patient       Patient will benefit from skilled therapeutic intervention in order to improve the following deficits and impairments:  Abnormal gait, Dizziness, Impaired vision/preception, Decreased balance  Visit Diagnosis: 1. Dizziness and giddiness   2. Other abnormalities of gait and mobility        Problem List Patient Active Problem List   Diagnosis Date Noted  . Dizziness 12/07/2018  . Vitamin D deficiency 12/02/2017  . Constipation 09/25/2015  . History of adenomatous polyp of colon 09/18/2014  . Hyperlipidemia 12/20/2012    Kynadee Dam, PT 12/15/2018, 4:28 PM  [facility] [address] [place], [place], [address] Phone: [phone]   Fax:  [phone]  Name: Mike Hall MRN: [id number] Date of Birth: 1951-01-08

## 2018-12-15 NOTE — Patient Instructions (Signed)
HEP :   Access Code: [email or web address]  URL: [email or web address]  Date: 12/15/2018  Prepared by: Rudell Cobb   Exercises Standing Gaze Stabilization with Head Nod - 3x daily - 7x weekly Standing Gaze Stabilization with Head Rotation - 3x daily - 7x weekly Romberg Stance Eyes Closed on Foam Pad - 3 reps - 1 sets - 30 seconds hold - 2x daily - 7x weekly

## 2018-12-20 DIAGNOSIS — R69 Illness, unspecified: Secondary | ICD-10-CM | POA: Diagnosis not present

## 2018-12-23 ENCOUNTER — Other Ambulatory Visit: Payer: Self-pay

## 2018-12-23 ENCOUNTER — Ambulatory Visit
Admission: RE | Admit: 2018-12-23 | Discharge: 2018-12-23 | Disposition: A | Discharge status: Home / Self Care | Payer: Medicare HMO | Source: Ambulatory Visit | Attending: Family Medicine | Admitting: Family Medicine

## 2018-12-23 DIAGNOSIS — H81399 Other peripheral vertigo, unspecified ear: Secondary | ICD-10-CM

## 2018-12-23 NOTE — Progress Notes (Signed)
Dr Marigene Ehlers interpretation of your lab work:  Good news! Your MRI is normal - you have a few age related changes but nothing concerning. Your symptoms should improve with therapy.   If you have any additional questions, please give [place] a call or send [place] a message through [place].  Take care, Dr Jerline Pain

## 2018-12-24 ENCOUNTER — Encounter

## 2019-01-06 ENCOUNTER — Other Ambulatory Visit: Payer: Self-pay

## 2019-01-06 ENCOUNTER — Encounter: Payer: Self-pay | Admitting: Physical Therapy

## 2019-01-06 ENCOUNTER — Ambulatory Visit: Payer: Medicare HMO | Admitting: Physical Therapy

## 2019-01-06 DIAGNOSIS — R42 Dizziness and giddiness: Secondary | ICD-10-CM

## 2019-01-06 DIAGNOSIS — R2689 Other abnormalities of gait and mobility: Secondary | ICD-10-CM | POA: Diagnosis not present

## 2019-01-06 NOTE — Patient Instructions (Addendum)
Access Code: [email or web address]  URL: [email or web address]  Date: 01/06/2019  Prepared by: Misty Stanley   Exercises Standing Gaze Stabilization with Head Nod - 60 seconds hold - 3x daily - 7x weekly Standing Gaze Stabilization with Head Rotation - 60 seconds hold - 3x daily - 7x weekly Romberg Stance Eyes Closed on Foam Pad - 10 reps - 2 sets - 2x daily - 7x weekly Walking with Head Rotation - 4 sets - 2x daily - 7x weeklyomberg Stance Eyes Closed on Foam Pad - 3 reps - 1 sets - 30 seconds hold - 2x daily - 7x weekly

## 2019-01-06 NOTE — Therapy (Signed)
[facility] [address] [place], [place], [address] Phone: [phone]   Fax:  [phone]  Physical Therapy Treatment  Patient Details  Name: Mike Hall MRN: [id number] Date of Birth: 1951/06/03 Referring Provider (PT): Dimas Chyle, MD   Encounter Date: 01/06/2019   CLINIC OPERATION CHANGES: Outpatient [facility] is open at lower capacity following universal masking, social distancing, and patient screening.  The patient's COVID risk of complications score is 2.   PT End of Session - 01/06/19 1153    Visit Number  2    Number of Visits  8    Date for PT Re-Evaluation  02/13/19    Authorization Type  aetna medicare    PT Start Time  1058    PT Stop Time  1145    PT Time Calculation (min)  47 min    Activity Tolerance  Patient tolerated treatment well    Behavior During Therapy  WFL for tasks assessed/performed       Past Medical History:  Diagnosis Date  . Hyperlipidemia   . Pre-syncope     Past Surgical History:  Procedure Laterality Date  . none      There were no vitals filed for this visit.  Subjective Assessment - 01/06/19 1109    Subjective  Feeling better but is flustered that this is taking longer than last time.    Pertinent History  h/o BPPV in 2013    Patient Stated Goals  Get the eyes and head back to normal.    Currently in Pain?  No/denies         [facility] PT Assessment - 01/06/19 1109      Functional Gait  Assessment   Gait assessed   Yes    Gait Level Surface  Walks 20 ft in less than 5.5 sec, no assistive devices, good speed, no evidence for imbalance, normal gait pattern, deviates no more than 6 in outside of the 12 in walkway width.    Change in Gait Speed  Able to smoothly change walking speed without loss of balance or gait deviation. Deviate no more than 6 in outside of the 12 in walkway width.    Gait with Horizontal Head Turns  Performs head turns smoothly with slight  change in gait velocity (eg, minor disruption to smooth gait path), deviates 6-10 in outside 12 in walkway width, or uses an assistive device.    Gait with Vertical Head Turns  Performs head turns with no change in gait. Deviates no more than 6 in outside 12 in walkway width.    Gait and Pivot Turn  Pivot turns safely within 3 sec and stops quickly with no loss of balance.    Step Over Obstacle  Is able to step over 2 stacked shoe boxes taped together (9 in total height) without changing gait speed. No evidence of imbalance.    Gait with Narrow Base of Support  Is able to ambulate for 10 steps heel to toe with no staggering.    Gait with Eyes Closed  Walks 20 ft, slow speed, abnormal gait pattern, evidence for imbalance, deviates 10-15 in outside 12 in walkway width. Requires more than 9 sec to ambulate 20 ft.    Ambulating Backwards  Walks 20 ft, no assistive devices, good speed, no evidence for imbalance, normal gait    Steps  Alternating feet, no rail.    Total Score  27    FGA comment:  27/30  Vestibular Treatment/Exercise - 01/06/19 1118      Vestibular Treatment/Exercise   Vestibular Treatment Provided  Gaze    Gaze Exercises  X1 Viewing Horizontal;X1 Viewing Vertical      X1 Viewing Horizontal   Foot Position  feet apart > feet together    Comments  progressed to 60 seconds      X1 Viewing Vertical   Foot Position  feet apart > feet together    Comments  progressed to 60 seconds         Balance Exercises - 01/06/19 1147      Balance Exercises: Standing   Standing Eyes Closed  Narrow base of support (BOS);Head turns;Foam/compliant surface;Other reps (comment);30 secs   feet together; progressed to 10 head turns/nods   Gait with Head Turns  Forward;Retro;4 reps   R/L every 3 steps       PT Education - 01/06/19 1152    Education Details  updated HEP, further information and hand out on vestibular neuritis/labyrinthitis    Person(s)  Educated  Patient    Methods  Explanation;Demonstration;Handout    Comprehension  Verbalized understanding;Returned demonstration       PT Short Term Goals - 01/06/19 1206      PT SHORT TERM GOAL #1   Title  The patient will be indep with HEP for gaze adaptation, multi-sensory balance use.    Time  4    Period  Weeks    Target Date  01/14/19      PT SHORT TERM GOAL #2   Title  The patient will tolerate gaze x 1 viewing at self selected pace without c/o visual blurring or dizziness x 45 seconds.    Time  4    Period  Weeks    Status  Achieved    Target Date  01/14/19      PT SHORT TERM GOAL #3   Title  The patient will improve SOT to score WNLs use of visual and vestibular feedback for balance.    Time  4    Period  Weeks    Target Date  01/14/19      PT SHORT TERM GOAL #4   Title  The patient will be further assessed on FGA and goal to follow.    Time  4    Period  Weeks    Status  Achieved    Target Date  01/14/19        PT Long Term Goals - 01/06/19 1207      PT LONG TERM GOAL #1   Title  The patient will return demo HEP for post d/c progression.  (All LTG due by 02/13/19)    Time  8    Period  Weeks      PT LONG TERM GOAL #2   Title  The patient will improve FGA by 2 points from established baseline.    Baseline  27/30    Time  8    Period  Weeks    Status  Revised      PT LONG TERM GOAL #3   Title  The patient will tolerate gaze x 1 viewing x 2 minutes without c/o dizziness or visual blurring.    Time  8    Period  Weeks      PT LONG TERM GOAL #4   Title  The patient will reduce SVA vs DVA from 5 line to 4 line difference demo'ing improved VOR.    Time  8  Period  Weeks            Plan - 01/06/19 1153    Clinical Impression Statement  Treatment session focused on further assessment of balance and falls risk during dynamic gait with FGA.  Pt demonstrates low falls risk and only demonstrated veering with eyes closed and head turns.  Reviewed and  upgraded gaze adaptation, corner balance and added walking with head turns.  Pt tolerated well; will continue to progress towards LTG.    Stability/Clinical Decision Making  Stable/Uncomplicated    Rehab Potential  Excellent    PT Frequency  1x / week    PT Duration  8 weeks    PT Treatment/Interventions  ADLs/Self Care Home Management;Neuromuscular re-education;Vestibular;Manual techniques;Patient/family education;Balance training;Therapeutic exercise;Therapeutic activities;Functional mobility training;Stair training;Gait training    PT Next Visit Plan  Check STG.  Continued to progress HEP, gaze adaptation and dynamic balance especially with eyes closed, compliant surfaces and with head turns    PT Home Exercise Plan  Access Code: [email or web address]    Consulted and Agree with Plan of Care  Patient       Patient will benefit from skilled therapeutic intervention in order to improve the following deficits and impairments:  Abnormal gait, Dizziness, Impaired vision/preception, Decreased balance  Visit Diagnosis: 1. Dizziness and giddiness   2. Other abnormalities of gait and mobility        Problem List Patient Active Problem List   Diagnosis Date Noted  . Dizziness 12/07/2018  . Vitamin D deficiency 12/02/2017  . Constipation 09/25/2015  . History of adenomatous polyp of colon 09/18/2014  . Hyperlipidemia 12/20/2012    Rico Junker, PT, DPT 01/06/19    12:09 PM    [facility] [address] [place], [place], [address] Phone: [phone]   Fax:  [phone]  Name: Mike Hall MRN: [id number] Date of Birth: [birth year]

## 2019-01-07 ENCOUNTER — Encounter

## 2019-01-07 ENCOUNTER — Other Ambulatory Visit: Payer: Medicare HMO

## 2019-01-12 ENCOUNTER — Encounter: Payer: Self-pay | Admitting: Rehabilitative and Restorative Service Providers"

## 2019-01-12 ENCOUNTER — Other Ambulatory Visit: Payer: Self-pay

## 2019-01-12 ENCOUNTER — Ambulatory Visit: Payer: Medicare HMO | Attending: Family Medicine | Admitting: Rehabilitative and Restorative Service Providers"

## 2019-01-12 DIAGNOSIS — R42 Dizziness and giddiness: Secondary | ICD-10-CM | POA: Insufficient documentation

## 2019-01-12 DIAGNOSIS — R2689 Other abnormalities of gait and mobility: Secondary | ICD-10-CM | POA: Insufficient documentation

## 2019-01-12 NOTE — Therapy (Signed)
[facility] [address] [place], [place], [address] Phone: [phone]   Fax:  [phone]  Physical Therapy Treatment  Patient Details  Name: Mike Hall MRN: [id number] Date of Birth: Oct 01, 1950 Referring Provider (PT): Dimas Chyle, MD  CLINIC OPERATION CHANGES: Outpatient [facility]hab is open at lower capacity following universal masking, social distancing, and patient screening.  The patient's COVID risk of complications score is 2.   Encounter Date: 01/12/2019  PT End of Session - 01/12/19 0940    Visit Number  3    Number of Visits  8    Date for PT Re-Evaluation  02/13/19    Authorization Type  aetna medicare    PT Start Time  [phone]    PT Stop Time  1015    PT Time Calculation (min)  42 min    Activity Tolerance  Patient tolerated treatment well    Behavior During Therapy  [facility] for tasks assessed/performed       Past Medical History:  Diagnosis Date  . Hyperlipidemia   . Pre-syncope     Past Surgical History:  Procedure Laterality Date  . none      There were no vitals filed for this visit.  Subjective Assessment - 01/12/19 0933    Subjective  The patient reports things are improving with HEP.  He is getting less dizzy and less visual movement associated with head motion.    Pertinent History  h/o BPPV in 2013    Patient Stated Goals  Get the eyes and head back to normal.    Currently in Pain?  No/denies                       [facility] Adult PT Treatment/Exercise - 01/12/19 1001      Ambulation/Gait   Ambulation/Gait  Yes    Ambulation/Gait Assistance  7: Independent    Ambulation Distance (Feet)  500 Feet    Assistive device  None    Gait Pattern  Within Functional Limits    Ambulation Surface  Level;Indoor    Gait Comments  The patient is indep  with dynamic gait, gait with tennis ball toss, direction changes and gait with horizontal head motion.      Standardized Balance  Assessment   Standardized Balance Assessment  Balance Master Testing      High Level Balance   High Level Balance Comments  SOT=81%      Neuro Re-ed    Neuro Re-ed Details   Standing on compliant surfaces including rocker board with head motion + eyes open,  Rocker board + eyes closed, and then reactive balance strategies.  Compliant foam with eyes closed and quick head motions.  Turning in place 180 a nd 360 degrees working on visual spotting and then faster movements.      Vestibular Treatment/Exercise - 01/12/19 1004      Vestibular Treatment/Exercise   Vestibular Treatment Provided  Gaze    Gaze Exercises  X1 Viewing Horizontal;X1 Viewing Vertical      X1 Viewing Horizontal   Foot Position  feet apart    Comments  x 60 seconds with minimal dizziness/ visual blurring; partial heel/toe with gaze x 30 seconds added to HEP.      X1 Viewing Vertical   Foot Position  feet apart    Comments  d/c due to no difficulty with vertical plane.            PT Education -  01/12/19 8016    Education Details  The patient asks more about vestibular weakness and how therapy helps, we discussed his impairments are most consistent with vestibular hypofunction.    Person(s) Educated  Patient    Methods  Explanation    Comprehension  Verbalized understanding       PT Short Term Goals - 01/12/19 1254      PT SHORT TERM GOAL #1   Title  The patient will be indep with HEP for gaze adaptation, multi-sensory balance use.    Time  4    Period  Weeks    Status  Achieved    Target Date  01/14/19      PT SHORT TERM GOAL #2   Title  The patient will tolerate gaze x 1 viewing at self selected pace without c/o visual blurring or dizziness x 45 seconds.    Time  4    Period  Weeks    Status  Achieved    Target Date  01/14/19      PT SHORT TERM GOAL #3   Title  The patient will improve SOT to score WNLs use of visual and vestibular feedback for balance.    Baseline  Scored 81% today, WNLs use  of sensory systems for balance.    Time  4    Period  Weeks    Status  Achieved    Target Date  01/14/19      PT SHORT TERM GOAL #4   Title  The patient will be further assessed on FGA and goal to follow.    Time  4    Period  Weeks    Status  Achieved    Target Date  01/14/19        PT Long Term Goals - 01/06/19 1207      PT LONG TERM GOAL #1   Title  The patient will return demo HEP for post d/c progression.  (All LTG due by 02/13/19)    Time  8    Period  Weeks      PT LONG TERM GOAL #2   Title  The patient will improve FGA by 2 points from established baseline.    Baseline  27/30    Time  8    Period  Weeks    Status  Revised      PT LONG TERM GOAL #3   Title  The patient will tolerate gaze x 1 viewing x 2 minutes without c/o dizziness or visual blurring.    Time  8    Period  Weeks      PT LONG TERM GOAL #4   Title  The patient will reduce SVA vs DVA from 5 line to 4 line difference demo'ing improved VOR.    Time  8    Period  Weeks            Plan - 01/12/19 1304    Clinical Impression Statement  The patient met all STGs.  He is WNLs at this time on sensory organization test and balance is improving.  He continues with visual blurring with head motion as chief complaint.  PT to continue to progress to LTGs.    PT Treatment/Interventions  ADLs/Self Care Home Management;Neuromuscular re-education;Vestibular;Manual techniques;Patient/family education;Balance training;Therapeutic exercise;Therapeutic activities;Functional mobility training;Stair training;Gait training    PT Next Visit Plan  check HEP, head motion, 360 degree turns.    Consulted and Agree with Plan of Care  Patient  Patient will benefit from skilled therapeutic intervention in order to improve the following deficits and impairments:  Abnormal gait, Dizziness, Impaired vision/preception, Decreased balance  Visit Diagnosis: 1. Dizziness and giddiness   2. Other abnormalities of gait and  mobility        Problem List Patient Active Problem List   Diagnosis Date Noted  . Dizziness 12/07/2018  . Vitamin D deficiency 12/02/2017  . Constipation 09/25/2015  . History of adenomatous polyp of colon 09/18/2014  . Hyperlipidemia 12/20/2012    Mike Hall, PT 01/12/2019, 1:07 PM  [facility] [address] [place], [place], [address] Phone: [phone]   Fax:  [phone]  Name: JIMIE KUWAHARA MRN: [id number] Date of Birth: 1950/12/28

## 2019-01-27 ENCOUNTER — Ambulatory Visit: Payer: Medicare HMO | Admitting: Rehabilitative and Restorative Service Providers"

## 2019-01-27 ENCOUNTER — Other Ambulatory Visit: Payer: Self-pay

## 2019-01-27 ENCOUNTER — Encounter: Payer: Self-pay | Admitting: Rehabilitative and Restorative Service Providers"

## 2019-01-27 DIAGNOSIS — R42 Dizziness and giddiness: Secondary | ICD-10-CM | POA: Diagnosis not present

## 2019-01-27 DIAGNOSIS — R2689 Other abnormalities of gait and mobility: Secondary | ICD-10-CM | POA: Diagnosis not present

## 2019-01-27 NOTE — Therapy (Signed)
[facility] [address] [place], [place], [address] Phone: [phone]   Fax:  [phone]  Physical Therapy Treatment  Patient Details  Name: Mike Hall MRN: [id number] Date of Birth: 14-Sep-1950 Referring Provider (PT): Dimas Chyle, MD  CLINIC OPERATION CHANGES: Outpatient [facility]hab is open at lower capacity following universal masking, social distancing, and patient screening.  The patient's COVID risk of complications score is 2.  Encounter Date: 01/27/2019  PT End of Session - 01/27/19 1153    Visit Number  4    Number of Visits  8    Date for PT Re-Evaluation  02/13/19    Authorization Type  aetna medicare    PT Start Time  1150    PT Stop Time  1230    PT Time Calculation (min)  40 min    Activity Tolerance  Patient tolerated treatment well    Behavior During Therapy  WFL for tasks assessed/performed       Past Medical History:  Diagnosis Date  . Hyperlipidemia   . Pre-syncope     Past Surgical History:  Procedure Laterality Date  . none      There were no vitals filed for this visit.  Subjective Assessment - 01/27/19 1151    Subjective  The patient notes everything is improving, but he can't get the vision fixed.  He is driving, but notes his head can feel "like jello" at times waiting to let things settle.    Pertinent History  h/o BPPV in 2013    Patient Stated Goals  Get the eyes and head back to normal.    Currently in Pain?  No/denies             Vestibular Assessment - 01/27/19 1214      Visual Acuity   Static  line 7    Dynamic  line 4               [facility] Adult PT Treatment/Exercise - 01/27/19 1222      Self-Care   Self-Care  Other Self-Care Comments    Other Self-Care Comments   Discussed working on updated HEP x 2-3 weeks in order to continue to progress habituation and gaze.      Vestibular Treatment/Exercise - 01/27/19 1208      Vestibular  Treatment/Exercise   Vestibular Treatment Provided  Gaze;Habituation    Habituation Exercises  Seated Horizontal Head Turns;Standing Horizontal Head Turns;Standing Diagonal Head Turns;360 degree Turns    Gaze Exercises  X1 Viewing Horizontal      Seated Horizontal Head Turns   Symptom Description   to mimic looking in his blind spot, performed 5 reps to each side.      Standing Diagonal Head Turns   Symptiom Description   performed x 5 reps without difficulty or provocation of symptoms.      360 degree Turns   Number of Reps   5    COMMENT  to the right continues to provoke symptoms      X1 Viewing Horizontal   Foot Position  feet together    Comments  120 seconds, then progressed to feet tandem x 30 seconds.              PT Short Term Goals - 01/12/19 1254      PT SHORT TERM GOAL #1   Title  The patient will be indep with HEP for gaze adaptation, multi-sensory balance use.    Time  4  Period  Weeks    Status  Achieved    Target Date  01/14/19      PT SHORT TERM GOAL #2   Title  The patient will tolerate gaze x 1 viewing at self selected pace without c/o visual blurring or dizziness x 45 seconds.    Time  4    Period  Weeks    Status  Achieved    Target Date  01/14/19      PT SHORT TERM GOAL #3   Title  The patient will improve SOT to score WNLs use of visual and vestibular feedback for balance.    Baseline  Scored 81% today, WNLs use of sensory systems for balance.    Time  4    Period  Weeks    Status  Achieved    Target Date  01/14/19      PT SHORT TERM GOAL #4   Title  The patient will be further assessed on FGA and goal to follow.    Time  4    Period  Weeks    Status  Achieved    Target Date  01/14/19        PT Long Term Goals - 01/27/19 1223      PT LONG TERM GOAL #1   Title  The patient will return demo HEP for post d/c progression.  (All LTG due by 02/13/19)    Time  8    Period  Weeks    Status  On-going      PT LONG TERM GOAL #2    Title  The patient will improve FGA by 2 points from established baseline.    Baseline  27/30    Time  8    Period  Weeks    Status  Revised      PT LONG TERM GOAL #3   Title  The patient will tolerate gaze x 1 viewing x 2 minutes without c/o dizziness or visual blurring.    Time  8    Period  Weeks    Status  Achieved      PT LONG TERM GOAL #4   Title  The patient will reduce SVA vs DVA from 5 line to 4 line difference demo'ing improved VOR.    Baseline  The patient has 3 line difference today.    Time  8    Period  Weeks    Status  Achieved            Plan - 01/27/19 1223    Clinical Impression Statement  The patient met LTG for SVA versus DVA.  He is still functionally noting sensation that eyes/head have to catch up when driving, especially on unlevel road.  PT progressed HEP and recommended one f/u if needed in 3 weeks to further progress activities.  He is not up to 12 minutes/day of gaze, and I anticipate he will adapt without further exercises needed. Patient to call to update therapist if able to d/c before next visit.    PT Treatment/Interventions  ADLs/Self Care Home Management;Neuromuscular re-education;Vestibular;Manual techniques;Patient/family education;Balance training;Therapeutic exercise;Therapeutic activities;Functional mobility training;Stair training;Gait training    PT Next Visit Plan  f/u in 3 weeks.    Consulted and Agree with Plan of Care  Patient       Patient will benefit from skilled therapeutic intervention in order to improve the following deficits and impairments:  Abnormal gait, Dizziness, Impaired vision/preception, Decreased balance  Visit Diagnosis: Dizziness and giddiness  Other  abnormalities of gait and mobility     Problem List Patient Active Problem List   Diagnosis Date Noted  . Dizziness 12/07/2018  . Vitamin D deficiency 12/02/2017  . Constipation 09/25/2015  . History of adenomatous polyp of colon 09/18/2014  .  Hyperlipidemia 12/20/2012    Saahas Hidrogo, PT 01/27/2019, 12:25 PM  [facility] [address] [place], [place], [address] Phone: [phone]   Fax:  [phone]  Name: Mike Hall MRN: [id number] Date of Birth: [birth year]

## 2019-01-27 NOTE — Patient Instructions (Signed)
Access Code: [email or web address]  URL: [email or web address]  Date: 01/27/2019  Prepared by: Rudell Cobb   Exercises Turning in Corner 360 - 5 reps - 1 sets - 2x daily - 7x weekly Standing Gaze Stabilization with Head Rotation - 2 minutes hold - 2 sets - 3x daily - 7x weekly Seated Left Head Turns Vestibular Habituation - 5 reps - 1 sets - 2x daily - 7x weekly

## 2019-02-03 ENCOUNTER — Ambulatory Visit: Payer: Medicare HMO | Admitting: Rehabilitative and Restorative Service Providers"

## 2019-02-10 ENCOUNTER — Ambulatory Visit: Payer: Medicare HMO | Admitting: Rehabilitative and Restorative Service Providers"

## 2019-02-16 ENCOUNTER — Ambulatory Visit: Payer: Medicare HMO | Admitting: Rehabilitative and Restorative Service Providers"

## 2019-03-01 DIAGNOSIS — R69 Illness, unspecified: Secondary | ICD-10-CM | POA: Diagnosis not present

## 2019-03-03 DIAGNOSIS — H524 Presbyopia: Secondary | ICD-10-CM | POA: Diagnosis not present

## 2019-03-08 DIAGNOSIS — Z0100 Encounter for examination of eyes and vision without abnormal findings: Secondary | ICD-10-CM | POA: Diagnosis not present

## 2019-03-11 ENCOUNTER — Ambulatory Visit: Payer: Medicare HMO | Admitting: Rehabilitative and Restorative Service Providers"

## 2019-03-21 ENCOUNTER — Encounter: Payer: Self-pay | Admitting: Rehabilitative and Restorative Service Providers"

## 2019-03-21 NOTE — Therapy (Signed)
[facility] [address] [place], [place], [address] Phone: [phone]   Fax:  [phone]  Patient Details  Name: Mike Hall MRN: [id number] Date of Birth: Jan 11, 1951 Referring Provider:  No ref. provider found  Encounter Date: last encounter 01/27/19  PHYSICAL THERAPY DISCHARGE SUMMARY  Visits from Start of Care: 4  Current functional level related to goals / functional outcomes: PT Short Term Goals - 01/12/19 1254      PT SHORT TERM GOAL #1   Title  The patient will be indep with HEP for gaze adaptation, multi-sensory balance use.    Time  4    Period  Weeks    Status  Achieved    Target Date  01/14/19      PT SHORT TERM GOAL #2   Title  The patient will tolerate gaze x 1 viewing at self selected pace without c/o visual blurring or dizziness x 45 seconds.    Time  4    Period  Weeks    Status  Achieved    Target Date  01/14/19      PT SHORT TERM GOAL #3   Title  The patient will improve SOT to score WNLs use of visual and vestibular feedback for balance.    Baseline  Scored 81% today, WNLs use of sensory systems for balance.    Time  4    Period  Weeks    Status  Achieved    Target Date  01/14/19      PT SHORT TERM GOAL #4   Title  The patient will be further assessed on FGA and goal to follow.    Time  4    Period  Weeks    Status  Achieved    Target Date  01/14/19      PT Long Term Goals - 01/27/19 1223      PT LONG TERM GOAL #1   Title  The patient will return demo HEP for post d/c progression.  (All LTG due by 02/13/19)    Time  8    Period  Weeks    Status  On-going      PT LONG TERM GOAL #2   Title  The patient will improve FGA by 2 points from established baseline.    Baseline  27/30    Time  8    Period  Weeks    Status  Revised      PT LONG TERM GOAL #3   Title  The patient will tolerate gaze x 1 viewing x 2 minutes without c/o dizziness or visual blurring.    Time  8    Period   Weeks    Status  Achieved      PT LONG TERM GOAL #4   Title  The patient will reduce SVA vs DVA from 5 line to 4 line difference demo'ing improved VOR.    Baseline  The patient has 3 line difference today.    Time  8    Period  Weeks    Status  Achieved      Patient called to cancel remaining visits due to feeling better.   Remaining deficits: Called to cx due to feeling better   Education / Equipment: Home program  Plan: Patient agrees to discharge.  Patient goals were partially met. Patient is being discharged due to the patient's request.  ?????feeling better.         Thank you for the referral  of this patient. Rudell Cobb, MPT    Ethlyn Alto 03/21/2019, 11:48 AM  [facility] [address] [place], [place], [address] Phone: [phone]   Fax:  [phone]

## 2019-06-01 DIAGNOSIS — Z86018 Personal history of other benign neoplasm: Secondary | ICD-10-CM | POA: Diagnosis not present

## 2019-06-01 DIAGNOSIS — L821 Other seborrheic keratosis: Secondary | ICD-10-CM | POA: Diagnosis not present

## 2019-06-01 DIAGNOSIS — D225 Melanocytic nevi of trunk: Secondary | ICD-10-CM | POA: Diagnosis not present

## 2019-06-01 DIAGNOSIS — Z23 Encounter for immunization: Secondary | ICD-10-CM | POA: Diagnosis not present

## 2019-06-01 DIAGNOSIS — L570 Actinic keratosis: Secondary | ICD-10-CM | POA: Diagnosis not present

## 2019-07-13 DIAGNOSIS — Z23 Encounter for immunization: Secondary | ICD-10-CM | POA: Diagnosis not present

## 2019-08-24 ENCOUNTER — Telehealth: Payer: Self-pay | Admitting: Family Medicine

## 2019-08-24 NOTE — Telephone Encounter (Signed)
I spoke with the patient's wife, Inez Catalina, to schedule their  AWV-I with Loma Sousa ([place]) and CPE w/ Dr. Yong Channel.  She said that she will call back to schedule it because she wasn't at home near her calendar.  If patient calls back, please schedule Medicare Wellness Visit (initial) with Loma Sousa at next available opening and CPE with Dr. Yong Channel at next available opening. VDM (Dee-Dee)

## 2019-09-06 DIAGNOSIS — R69 Illness, unspecified: Secondary | ICD-10-CM | POA: Diagnosis not present

## 2019-09-12 DIAGNOSIS — R69 Illness, unspecified: Secondary | ICD-10-CM | POA: Diagnosis not present

## 2019-10-27 ENCOUNTER — Other Ambulatory Visit: Payer: Self-pay

## 2019-10-27 ENCOUNTER — Telehealth: Payer: Self-pay | Admitting: Family Medicine

## 2019-10-27 DIAGNOSIS — E785 Hyperlipidemia, unspecified: Secondary | ICD-10-CM

## 2019-10-27 DIAGNOSIS — Z0000 Encounter for general adult medical examination without abnormal findings: Secondary | ICD-10-CM

## 2019-10-27 DIAGNOSIS — E559 Vitamin D deficiency, unspecified: Secondary | ICD-10-CM

## 2019-10-27 NOTE — Telephone Encounter (Signed)
Patient would like to know if he could get his lab work done but earlier that morning since his wife is getting seen at 8:20 am. Patient stated he cannot fast till 11am he get a horrible headaches.

## 2019-10-27 NOTE — Telephone Encounter (Signed)
Yes, that's fine 

## 2019-10-27 NOTE — Telephone Encounter (Signed)
Patient has been notified

## 2019-10-31 NOTE — Progress Notes (Signed)
Phone: [phone]   Subjective:  Patient presents today for their annual physical. Chief complaint-noted.   See problem oriented charting- ROS- full  review of systems was completed and negative   The following were reviewed and entered/updated in epic: Past Medical History:  Diagnosis Date  . Hyperlipidemia   . Pre-syncope    Patient Active Problem List   Diagnosis Date Noted  . Hyperlipidemia 12/20/2012    Priority: Medium  . History of adenomatous polyp of colon 09/18/2014    Priority: Low  . Dizziness 12/07/2018  . Vitamin D deficiency 12/02/2017  . Constipation 09/25/2015   Past Surgical History:  Procedure Laterality Date  . none      Family History  Problem Relation Age of Onset  . Uterine cancer Mother        22  . Coronary artery disease Father        age 52  . Diabetes Mellitus I Daughter   . Hypertension Brother   . Colon cancer Neg Hx     Medications- reviewed and updated Current Outpatient Medications  Medication Sig Dispense Refill  . cholecalciferol (VITAMIN D3) 25 MCG (1000 UT) tablet Take 2,000 Units by mouth daily.     . DENTA 5000 PLUS 1.1 % CREA dental cream USE TO BRUSH TEETH AT BEDTIME, SPIT OUT, THEN FLOSS. DO NOT EAT, DRINK, OR RINSE FOR 30 MINUTES  11  . Flaxseed, Linseed, (FLAXSEED OIL PO) Take 1,000 mg by mouth. 3450 mg    . Magnesium Gluconate (MAGONATE PO) Take 1,200 mg by mouth.      No current facility-administered medications for this visit.    Allergies-reviewed and updated No Known Allergies  Social History   Social History Narrative   Married. 1 daughter. No grandkids.    GED      Works in [employer] in [place]. 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       Hobbies: time at home, yardwork   Objective  Objective:  BP 132/88   Pulse 63   Temp 98.1 F (36.7 C) (Temporal)   Ht 5\' 9"  (1.753 m)   Wt 181 lb 6.1 oz (82.3 kg)   SpO2 97%   BMI 26.79 kg/m  Gen: NAD, resting comfortably HEENT: Mucous membranes are  moist. Oropharynx normal Cerumen impaction bilaterally-curetting was not successful-irrigation to be attempted-mineral oil versus ENT referral depending on results Neck: no thyromegaly CV: RRR no murmurs rubs or gallops Lungs: CTAB no crackles, wheeze, rhonchi Abdomen: soft/nontender/nondistended/normal bowel sounds. No rebound or guarding.  Ext: no edema Skin: warm, dry Neuro: grossly normal, moves all extremities, PERRLA    Assessment and Plan  69 y.o. male presenting for annual physical.  Health Maintenance counseling: 1. Anticipatory guidance: Patient counseled regarding regular dental exams -q6 months, eye exams -yearly,  avoiding smoking and second hand smoke , limiting alcohol to 2 beverages per day - 0 per day.   2. Risk factor reduction:  Advised patient of need for regular exercise and diet rich and fruits and vegetables to reduce risk of heart attack and stroke. Exercise- 2.5 miles five days a week with wife. Diet-reasonably healthy- actually down 5 lbs despite pandemic. Overweight per BMI but I do not think this accounts for his muscle mass well-I think his weight is very reasonable.  Wt Readings from Last 3 Encounters:  11/03/19 181 lb 6.1 oz (82.3 kg)  12/06/18 185 lb (83.9 kg)  08/23/18 186 lb (84.4 kg)  3. Immunizations/screenings/ancillary studies-declines pneumonia shot for now-we will consider  next year.  Due for tetanus shot and we discussed if gets a cut or scrape needs to get this updated-he wants to defer for now.  Discussed Shingrix-defers for now  Immunization History  Administered Date(s) Administered  . Influenza Split 03/03/2013  . Influenza, High Dose Seasonal PF 03/19/2018  . Influenza,inj,Quad PF,6+ Mos 02/28/2014, 03/08/2015, 02/26/2016, 03/17/2017  . Influenza-Unspecified 03/19/2018, 03/01/2019  . Moderna SARS-COVID-2 Vaccination 07/13/2019, 08/11/2019  . Zoster 05/16/2013  4. Prostate cancer screening- low risk prior PSA trend.  Would consider baseline  around 1-update PSA with labs today Lab Results  Component Value Date   PSA 0.57 08/23/2018   PSA 1.05 02/02/2018   PSA 1.25 12/03/2017   5. Colon cancer screening - history adenomatous colon polyp November 2016-she will be due this November 6. Skin cancer screening- Dr. Delman Cheadle dermatology. advised regular sunscreen use. Denies worrisome, changing, or new skin lesions.  7.  Never smoker 8. STD screening -monogamous and declines  Status of chronic or acute concerns   #hyperlipidemia S: Medication:none  Lab Results  Component Value Date   CHOL 218 (H) 08/23/2018   HDL 41.90 08/23/2018   LDLCALC 157 (H) 08/23/2018   TRIG 99.0 08/23/2018   CHOLHDL 5 08/23/2018   A/P:  10 year ascvd risk is 16.7%-we discussed a few options such as starting statin, continue to work on diet exercise, coronary CT-ultimately we jointly decided to pursue coronary CT to get more information-if there is any plaque/calcium then would consider statin-depending on level would likely determine strength of statin - dad does have CAD history  #Vitamin D deficiency S: Medication: Vitamin D3 2000 units Last vitamin D Lab Results  Component Value Date   VD25OH 39.24 08/23/2018  A/P: Hopefully controlled-update vitamin D with labs today  # elevated blood pressure S: hihgh normal BP on repeat diastolic Home readings #s: does not check Low salt diet  recommended BP Readings from Last 3 Encounters:  11/03/19 (!) 142/90--> 132/88  12/07/18 (!) 145/97 ER trip for vertigo- occasional mild recurrence  08/23/18 120/82  A/P: high normal diastolic reading today-last year blood pressure was 120/82 so I am not particularly concerned but we did discuss low-salt diet and continue efforts for healthy eating/regular exercise -may get a few checks at home  # vertigo last June was severe- recurrent mild issues intermittently and did vestibular rehab   Recommended follow up:  1 year physical Future Appointments  Date Time  Provider [place]  11/05/2020  8:20 AM Marin Olp, MD [facility]   Lab/Order associations: fasting   ICD-10-CM   1. Preventative health care  Z00.00 CBC with Differential/Platelet    Comprehensive metabolic panel    Lipid panel    PSA    VITAMIN D 25 Hydroxy (Vit-D Deficiency, Fractures)  2. Hyperlipidemia, unspecified hyperlipidemia type  E78.5 CBC with Differential/Platelet    Comprehensive metabolic panel    Lipid panel  3. Vitamin D deficiency  E55.9 VITAMIN D 25 Hydroxy (Vit-D Deficiency, Fractures)  4. Screening for prostate cancer  Z12.5 PSA   Return precautions advised.  Garret Reddish, MD

## 2019-10-31 NOTE — Patient Instructions (Addendum)
Health Maintenance Due  Topic Date Due  . COVID-19 Vaccine (1) updated in chart  Never done  . PNA vac Low Risk Adult (1 of 2 - PCV13) declined  Never done  . TETANUS/TDAP    Next time you are at the pharmacy see if they have the TDAP Vaccination available. If you do receive it let our office know so that we can update you records.  07/10/2018   Please stop by lab before you go If you have mychart- we will send your results within 3 business days of [place] receiving them.  If you do not have mychart- we will call you about results within 5 business days of [place] receiving them.   We will think about doing pneumonia , TDAP  and Shingles vaccination at your next physical. If you would like before let your office know.    We will order the CT today if you have not received call in two weeks to make that appointment let our office know.   Good job on weight loss. Down five pounds from last physical. Way to go.   Try to eat low salt diet. Information given on that today.   GI will be calling you in November for Colonoscopy. Let us know if any issues.   Try to keep check on blood pressure at home. If stays elevated give our office a call to make a follow up.   Mineral oil for ear full of wax Purchase mineral oil from laxative aisle Lay down on your side with ear that is bothering you facing up Use 3-4 drops with a dropper and place in ear for 30 seconds Place cotton swab outside of ear Turn to other side and allow this to drain Repeat 3-4 x a day Return to see [place] if not improving within a few days  Mike Hall , Thank you for taking time to come for your Medicare Wellness Visit. I appreciate your ongoing commitment to your health goals. Please review the following plan we discussed and let me know if I can assist you in the future.   These are the goals we discussed: Goals   Work on low salt diet Monitor your blood pressures at home.  Continue exercise and activity.      This is a list  of the screening recommended for you and due dates:  Health Maintenance  Topic Date Due  . Tetanus Vaccine  11/02/2020*  . Pneumonia vaccines (1 of 2 - PCV13) 11/02/2020*  . Flu Shot  01/08/2020  . Colon Cancer Screening  04/17/2020  . COVID-19 Vaccine  Completed  .  Hepatitis C: One time screening is recommended by Center for Disease Control  (CDC) for  adults born from [birth year] through 1963.   Completed  *Topic was postponed. The date shown is not the original due date.

## 2019-11-03 ENCOUNTER — Ambulatory Visit (INDEPENDENT_AMBULATORY_CARE_PROVIDER_SITE_OTHER): Payer: Medicare HMO | Admitting: Family Medicine

## 2019-11-03 ENCOUNTER — Other Ambulatory Visit: Payer: Self-pay

## 2019-11-03 ENCOUNTER — Encounter: Payer: Self-pay | Admitting: Family Medicine

## 2019-11-03 ENCOUNTER — Ambulatory Visit: Payer: Medicare HMO

## 2019-11-03 VITALS — BP 132/88 | HR 63 | Temp 98.1°F | Ht 69.0 in | Wt 181.4 lb

## 2019-11-03 DIAGNOSIS — Z0000 Encounter for general adult medical examination without abnormal findings: Secondary | ICD-10-CM | POA: Diagnosis not present

## 2019-11-03 DIAGNOSIS — E559 Vitamin D deficiency, unspecified: Secondary | ICD-10-CM

## 2019-11-03 DIAGNOSIS — E785 Hyperlipidemia, unspecified: Secondary | ICD-10-CM | POA: Diagnosis not present

## 2019-11-03 DIAGNOSIS — Z125 Encounter for screening for malignant neoplasm of prostate: Secondary | ICD-10-CM | POA: Diagnosis not present

## 2019-11-03 LAB — CBC WITH DIFFERENTIAL/PLATELET
Basophils Absolute: 0 10*3/uL (ref 0.0–0.1)
Basophils Relative: 0.7 % (ref 0.0–3.0)
Eosinophils Absolute: 0.2 10*3/uL (ref 0.0–0.7)
Eosinophils Relative: 4 % (ref 0.0–5.0)
HCT: 41.8 % (ref 39.0–52.0)
Hemoglobin: 14.3 g/dL (ref 13.0–17.0)
Lymphocytes Relative: 35.2 % (ref 12.0–46.0)
Lymphs Abs: 2.1 10*3/uL (ref 0.7–4.0)
MCHC: 34.1 g/dL (ref 30.0–36.0)
MCV: 89.3 fl (ref 78.0–100.0)
Monocytes Absolute: 0.5 10*3/uL (ref 0.1–1.0)
Monocytes Relative: 7.8 % (ref 3.0–12.0)
Neutro Abs: 3.1 10*3/uL (ref 1.4–7.7)
Neutrophils Relative %: 52.3 % (ref 43.0–77.0)
Platelets: 183 10*3/uL (ref 150.0–400.0)
RBC: 4.68 Mil/uL (ref 4.22–5.81)
RDW: 13.2 % (ref 11.5–15.5)
WBC: 6 10*3/uL (ref 4.0–10.5)

## 2019-11-03 LAB — COMPREHENSIVE METABOLIC PANEL
ALT: 19 U/L (ref 0–53)
AST: 23 U/L (ref 0–37)
Albumin: 4.8 g/dL (ref 3.5–5.2)
Alkaline Phosphatase: 64 U/L (ref 39–117)
BUN: 15 mg/dL (ref 6–23)
CO2: 30 mEq/L (ref 19–32)
Calcium: 9.8 mg/dL (ref 8.4–10.5)
Chloride: 103 mEq/L (ref 96–112)
Creatinine, Ser: 0.97 mg/dL (ref 0.40–1.50)
GFR: 77.23 mL/min (ref >60.00)
Glucose, Bld: 97 mg/dL (ref 70–99)
Potassium: 4.8 mEq/L (ref 3.5–5.1)
Sodium: 136 mEq/L (ref 135–145)
Total Bilirubin: 0.8 mg/dL (ref 0.2–1.2)
Total Protein: 7.2 g/dL (ref 6.0–8.3)

## 2019-11-03 LAB — LIPID PANEL
Cholesterol: 246 mg/dL — ABNORMAL HIGH (ref 0–200)
HDL: 44 mg/dL (ref >39.00)
LDL Cholesterol: 176 mg/dL — ABNORMAL HIGH (ref 0–99)
NonHDL: 201.61
Total CHOL/HDL Ratio: 6
Triglycerides: 130 mg/dL (ref 0.0–149.0)
VLDL: 26 mg/dL (ref 0.0–40.0)

## 2019-11-03 LAB — VITAMIN D 25 HYDROXY (VIT D DEFICIENCY, FRACTURES): VITD: 51.08 ng/mL (ref 30.00–100.00)

## 2019-11-03 LAB — PSA: PSA: 0.39 ng/mL (ref 0.10–4.00)

## 2019-11-03 NOTE — Progress Notes (Signed)
Phone: [phone]    Subjective:  Patient presents today for their annual wellness visit (initial )  Preventive Screening-Counseling & Management  Modifiable Risk Factors/behavioral risk assessment/psychosocial risk assessment Regular exercise: 2.5 miles per day 5 days a week Diet: healthy and [place] 5 lbs from last year  Wt Readings from Last 3 Encounters:  11/03/19 181 lb 6.1 oz (82.3 kg)  12/06/18 185 lb (83.9 kg)  08/23/18 186 lb (84.4 kg)   Smoking Status: Never Smoker Second Hand Smoking status: No smokers in home Alcohol intake: 0 per week  Cardiac risk factors:  advanced age (older than 47 for men, 72 for women)  untreated Hyperlipidemia - getting coronary CT no Hypertension -high normal today- he is going to monitor at ome No diabetes.  No results found for: HGBA1C Family History:  CAD age 93   Depression Screen/risk evaluation Risk factors: none.Marland Kitchen PHQ2 0  Depression screen [facility] 2/9 11/03/2019 08/23/2018 08/23/2018 05/04/2017 09/04/2016  Decreased Interest 0 0 0 0 0  Down, Depressed, Hopeless 0 0 0 0 0  PHQ - 2 Score 0 0 0 0 0    Functional ability and level of safety Mobility assessment:  timed get up and go <12 seconds Activities of Daily Living- Independent in ADLs (toileting, bathing, dressing, transferring, eating) and in IADLs (shopping, housekeeping, managing own medications, and handling finances) Home Safety: Loose rugs (no), smoke detectors (up to date), small pets (no), grab bars (no but no issues), stairs (14 and does well), life-alert system (cell phone) Hearing Difficulties: -patient endorses intermittent issues and does well if gets ear wax removed Fall Risk: None  Fall Risk  11/03/2019 08/23/2018 05/04/2017 02/22/2015 05/16/2013  Falls in the past year? 0 0 No No No  Number falls in past yr: 0 0 - - -  Injury with Fall? 0 0 - - -  Risk for fall due to : No Fall Risks - - - -  Follow up Falls evaluation completed - - - -   Opioid use history:  no  long term opioids use Self assessment of health status: "good"  Cognitive Testing             No reported trouble.   Mini cog: normal clock draw. 2/3 delayed recall. Normal test result   List the Names of Other Physician/Practitioners you currently use: Patient Care Team: Marin Olp, MD as PCP - General (Family Medicine) Jari Pigg, MD as Consulting Physician (Dermatology) Ladene Artist, MD as Consulting Physician (Gastroenterology)  Required Immunizations needed today:   Pneumovax, tdap, shingrix due- he declines for now Immunization History  Administered Date(s) Administered  . Influenza Split 03/03/2013  . Influenza, High Dose Seasonal PF 03/19/2018  . Influenza,inj,Quad PF,6+ Mos 02/28/2014, 03/08/2015, 02/26/2016, 03/17/2017  . Influenza-Unspecified 03/19/2018, 03/01/2019  . Moderna SARS-COVID-2 Vaccination 07/13/2019, 08/11/2019  . Zoster 05/16/2013   Health Maintenance  Topic Date Due  . Tetanus Vaccine  11/02/2020*  . Pneumonia vaccines (1 of 2 - PCV13) 11/02/2020*  . Flu Shot  01/08/2020  . Colon Cancer Screening  04/17/2020  . COVID-19 Vaccine  Completed  .  Hepatitis C: One time screening is recommended by Center for Disease Control  (CDC) for  adults born from [birth year] through 1963.   Completed  *Topic was postponed. The date shown is not the original due date.    Screening tests-   1. Colon cancer screening- 04/2015 and due 04/2020- auto recall noted 2. Lung Cancer screening- not a candidate 3. Skin cancer  screening- Dr. Delman Cheadle every year 4. Prostate cancer screening- low risk prior psa trend Lab Results  Component Value Date   PSA 0.57 08/23/2018   PSA 1.05 02/02/2018   PSA 1.25 12/03/2017   The following were reviewed and entered/updated in epic if appropriate: Past Medical History:  Diagnosis Date  . Hyperlipidemia   . Pre-syncope    Patient Active Problem List   Diagnosis Date Noted  . Hyperlipidemia 12/20/2012    Priority: Medium  .  History of adenomatous polyp of colon 09/18/2014    Priority: Low  . Dizziness 12/07/2018  . Vitamin D deficiency 12/02/2017  . Constipation 09/25/2015   Past Surgical History:  Procedure Laterality Date  . none      Family History  Problem Relation Age of Onset  . Uterine cancer Mother        73  . Coronary artery disease Father        age 30  . Diabetes Mellitus I Daughter   . Hypertension Brother   . Colon cancer Neg Hx     Medications- reviewed and updated Current Outpatient Medications  Medication Sig Dispense Refill  . cholecalciferol (VITAMIN D3) 25 MCG (1000 UT) tablet Take 2,000 Units by mouth daily.     . DENTA 5000 PLUS 1.1 % CREA dental cream USE TO BRUSH TEETH AT BEDTIME, SPIT OUT, THEN FLOSS. DO NOT EAT, DRINK, OR RINSE FOR 30 MINUTES  11  . Flaxseed, Linseed, (FLAXSEED OIL PO) Take 1,000 mg by mouth. 3450 mg    . Magnesium Gluconate (MAGONATE PO) Take 1,200 mg by mouth.      No current facility-administered medications for this visit.    Allergies-reviewed and updated No Known Allergies  Social History   Socioeconomic History  . Marital status: Married    Spouse name: Not on file  . Number of children: Not on file  . Years of education: Not on file  . Highest education level: Not on file  Occupational History  . Not on file  Tobacco Use  . Smoking status: Never Smoker  . Smokeless tobacco: Never Used  Substance and Sexual Activity  . Alcohol use: No  . Drug use: No  . Sexual activity: Not on file  Other Topics Concern  . Not on file  Social History Narrative   Married. 1 daughter. No grandkids.    GED      Works in [employer] in [place]. 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       Hobbies: time at home, yardwork   Social Determinants of Health   Financial Resource Strain:   . Difficulty of Paying Living Expenses:   Food Insecurity:   . Worried About Charity fundraiser in the Last Year:   . Arboriculturist in the Last Year:    Transportation Needs:   . Film/video editor (Medical):   Marland Kitchen Lack of Transportation (Non-Medical):   Physical Activity:   . Days of Exercise per Week:   . Minutes of Exercise per Session:   Stress:   . Feeling of Stress :   Social Connections:   . Frequency of Communication with Friends and Family:   . Frequency of Social Gatherings with Friends and Family:   . Attends Religious Services:   . Active Member of Clubs or Organizations:   . Attends Archivist Meetings:   Marland Kitchen Marital Status:       Objective:  BP 132/88   Pulse 63   Temp  98.1 F (36.7 C) (Temporal)   Ht 5\' 9"  (1.753 m)   Wt 181 lb 6.1 oz (82.3 kg)   SpO2 97%   BMI 26.79 kg/m  Gen: NAD, resting comfortably   Assessment/Plan:  AWV completed 1. Educated, counseled and referred based on above elements 2. Educated, counseled and referred as appropriate for preventative needs 3. Discussed and documented a written plan for preventiative services and screenings with personalized health advice- After Visit Summary was given to patient which included this plan   Status of chronic or acute concerns  See physical from today  Recommended follow up: 1 year cpe and awv Future Appointments  Date Time Provider [place]  11/05/2020  8:20 AM Marin Olp, MD [facility]    Lab/Order associations:   ICD-10-CM   1. Preventative health care  Z00.00 CBC with Differential/Platelet    Comprehensive metabolic panel    Lipid panel    PSA    VITAMIN D 25 Hydroxy (Vit-D Deficiency, Fractures)  2. Hyperlipidemia, unspecified hyperlipidemia type  E78.5 CBC with Differential/Platelet    Comprehensive metabolic panel    Lipid panel  3. Vitamin D deficiency  E55.9 VITAMIN D 25 Hydroxy (Vit-D Deficiency, Fractures)  4. Screening for prostate cancer  Z12.5 PSA    No orders of the defined types were placed in this encounter.   Return precautions advised.  Garret Reddish, MD

## 2019-11-09 ENCOUNTER — Other Ambulatory Visit: Payer: Self-pay

## 2019-11-09 ENCOUNTER — Encounter: Payer: Self-pay | Admitting: Family Medicine

## 2019-11-09 DIAGNOSIS — E785 Hyperlipidemia, unspecified: Secondary | ICD-10-CM

## 2019-11-10 NOTE — Telephone Encounter (Signed)
Can you check and see if we put this in right? And let me know what number I need to give patient to call for app? Thanks Ica Daye

## 2019-11-17 ENCOUNTER — Ambulatory Visit (INDEPENDENT_AMBULATORY_CARE_PROVIDER_SITE_OTHER)
Admission: RE | Admit: 2019-11-17 | Discharge: 2019-11-17 | Disposition: A | Discharge status: Home / Self Care | Payer: Self-pay | Source: Ambulatory Visit | Attending: Family Medicine | Admitting: Family Medicine

## 2019-11-17 ENCOUNTER — Other Ambulatory Visit: Payer: Self-pay

## 2019-11-17 DIAGNOSIS — E785 Hyperlipidemia, unspecified: Secondary | ICD-10-CM

## 2019-11-18 ENCOUNTER — Other Ambulatory Visit: Payer: Self-pay

## 2019-11-18 ENCOUNTER — Encounter: Payer: Self-pay | Admitting: Family Medicine

## 2019-11-18 MED ORDER — ROSUVASTATIN CALCIUM 40 MG PO TABS: 40.0000 mg | ORAL_TABLET | Freq: Every day | ORAL | 3 refills | Status: DC

## 2019-11-18 NOTE — Telephone Encounter (Signed)
Ok to send referral  

## 2019-11-21 ENCOUNTER — Other Ambulatory Visit: Payer: Self-pay

## 2019-11-21 DIAGNOSIS — E785 Hyperlipidemia, unspecified: Secondary | ICD-10-CM

## 2019-11-22 ENCOUNTER — Other Ambulatory Visit: Payer: Self-pay

## 2019-11-22 MED ORDER — ROSUVASTATIN CALCIUM 40 MG PO TABS: 40.0000 mg | ORAL_TABLET | Freq: Every day | ORAL | 3 refills | Status: DC

## 2019-11-30 DIAGNOSIS — K5900 Constipation, unspecified: Secondary | ICD-10-CM | POA: Diagnosis not present

## 2019-11-30 DIAGNOSIS — Z7982 Long term (current) use of aspirin: Secondary | ICD-10-CM | POA: Diagnosis not present

## 2019-11-30 DIAGNOSIS — Z809 Family history of malignant neoplasm, unspecified: Secondary | ICD-10-CM | POA: Diagnosis not present

## 2019-11-30 DIAGNOSIS — Z833 Family history of diabetes mellitus: Secondary | ICD-10-CM | POA: Diagnosis not present

## 2019-11-30 DIAGNOSIS — R030 Elevated blood-pressure reading, without diagnosis of hypertension: Secondary | ICD-10-CM | POA: Diagnosis not present

## 2019-11-30 DIAGNOSIS — Z8249 Family history of ischemic heart disease and other diseases of the circulatory system: Secondary | ICD-10-CM | POA: Diagnosis not present

## 2019-11-30 DIAGNOSIS — E785 Hyperlipidemia, unspecified: Secondary | ICD-10-CM | POA: Diagnosis not present

## 2019-11-30 DIAGNOSIS — Z803 Family history of malignant neoplasm of breast: Secondary | ICD-10-CM | POA: Diagnosis not present

## 2020-02-10 ENCOUNTER — Encounter: Payer: Self-pay | Admitting: Internal Medicine

## 2020-02-10 ENCOUNTER — Other Ambulatory Visit: Payer: Self-pay

## 2020-02-10 ENCOUNTER — Ambulatory Visit: Payer: Medicare HMO | Admitting: Internal Medicine

## 2020-02-10 VITALS — BP 120/84 | HR 59 | Ht 70.0 in | Wt 184.2 lb

## 2020-02-10 DIAGNOSIS — R0602 Shortness of breath: Secondary | ICD-10-CM

## 2020-02-10 DIAGNOSIS — T466X5A Adverse effect of antihyperlipidemic and antiarteriosclerotic drugs, initial encounter: Secondary | ICD-10-CM

## 2020-02-10 DIAGNOSIS — M791 Myalgia, unspecified site: Secondary | ICD-10-CM | POA: Diagnosis not present

## 2020-02-10 DIAGNOSIS — R931 Abnormal findings on diagnostic imaging of heart and coronary circulation: Secondary | ICD-10-CM | POA: Diagnosis not present

## 2020-02-10 DIAGNOSIS — E785 Hyperlipidemia, unspecified: Secondary | ICD-10-CM | POA: Diagnosis not present

## 2020-02-10 LAB — LIPID PANEL
Chol/HDL Ratio: 3.3 ratio (ref 0.0–5.0)
Cholesterol, Total: 157 mg/dL (ref 100–199)
HDL: 48 mg/dL (ref >39)
LDL Chol Calc (NIH): 93 mg/dL (ref 0–99)
Triglycerides: 83 mg/dL (ref 0–149)
VLDL Cholesterol Cal: 16 mg/dL (ref 5–40)

## 2020-02-10 NOTE — Patient Instructions (Signed)
Medication Instructions:  No Changes In Medications at this time.  *If you need a refill on your cardiac medications before your next appointment, please call your pharmacy*  Lab Work: Lipid Panel- today, please go by lab today after check out If you have labs (blood work) drawn today and your tests are completely normal, you will receive your results only by: Marland Kitchen MyChart Message (if you have MyChart) OR . A paper copy in the mail If you have any lab test that is abnormal or we need to change your treatment, we will call you to review the results.  Testing/Procedures:  Your physician has requested that you have en exercise stress myoview. For further information please visit [email or web address]. Please follow instruction sheet, as given. This will take place here at the [facility].   Follow-Up: At [facility], you and your health needs are our priority.  As part of our continuing mission to provide you with exceptional heart care, we have created designated Provider Care Teams.  These Care Teams include your primary Cardiologist (physician) and Advanced Practice Providers (APPs -  Physician Assistants and Nurse Practitioners) who all work together to provide you with the care you need, when you need it.  Your next appointment:   2 month(s) after the Exercise Myoview is completed.   The format for your next appointment:   In Person  Provider:   K. Mali Hilty, MD

## 2020-02-12 NOTE — Progress Notes (Signed)
LIPID CLINIC CONSULT NOTE  Chief Complaint:  Manage dyslipidemia  Primary Care Physician: Mike Olp, MD  Primary Cardiologist:  No primary care provider on file.  HPI:  Mike Hall is a 69 y.o. male who is being seen today for the evaluation of dyslipidemia at the request of Mike Olp, MD.  This is a pleasant 69 year old kindly referred for management of dyslipidemia.  He has a family history of heart disease in his father who had MI at age 35.  Recent labs showed a total cholesterol 157, triglycerides 83, HDL 48 and LDL of 93.  He did undergo CT coronary calcium scoring in June 2021 showing total calcium score of 752, 87th percentile for age and sex matched control.  Calcification was noted in the LAD and circumflex distributions.  Currently he reports some dyspnea and possibly some progressive fatigue.  Is been present over the past 6 months or so.  It may be slightly worse since he realized that he had some coronary artery disease.  PMHx:  Past Medical History:  Diagnosis Date  . Hyperlipidemia   . Pre-syncope     Past Surgical History:  Procedure Laterality Date  . none      FAMHx:  Family History  Problem Relation Age of Onset  . Uterine cancer Mother        80  . Coronary artery disease Father        age 26  . Diabetes Mellitus I Daughter   . Hypertension Brother   . Colon cancer Neg Hx     SOCHx:   reports that he has never smoked. He has never used smokeless tobacco. He reports that he does not drink alcohol and does not use drugs.  ALLERGIES:  No Known Allergies  ROS: Pertinent items noted in HPI and remainder of comprehensive ROS otherwise negative.  HOME MEDS: Current Outpatient Medications on File Prior to Visit  Medication Sig Dispense Refill  . cholecalciferol (VITAMIN D3) 25 MCG (1000 UT) tablet Take 2,000 Units by mouth daily.     . DENTA 5000 PLUS 1.1 % CREA dental cream USE TO BRUSH TEETH AT BEDTIME, SPIT OUT, THEN FLOSS.  DO NOT EAT, DRINK, OR RINSE FOR 30 MINUTES  11  . Magnesium Gluconate (MAGONATE PO) Take 1,200 mg by mouth.     . rosuvastatin (CRESTOR) 40 MG tablet Take 1 tablet (40 mg total) by mouth daily. 90 tablet 3  . STUDY - [place] - aspirin 81mg  or placebo (PI - [place])      No current facility-administered medications on file prior to visit.    LABS/IMAGING: No results found for this or any previous visit (from the past 48 hour(s)). No results found.  LIPID PANEL:    Component Value Date/Time   CHOL 157 02/10/2020 1108   CHOL 189 12/20/2012 1541   TRIG 83 02/10/2020 1108   TRIG 278 (H) 05/16/2013 1514   TRIG 164 (H) 12/20/2012 1541   HDL 48 02/10/2020 1108   HDL 37 (L) 05/16/2013 1514   HDL 41 12/20/2012 1541   CHOLHDL 3.3 02/10/2020 1108   CHOLHDL 6 11/03/2019 0940   VLDL 26.0 11/03/2019 0940   LDLCALC 93 02/10/2020 1108   LDLCALC 54 05/16/2013 1514   LDLCALC 115 (H) 12/20/2012 1541    WEIGHTS: Wt Readings from Last 3 Encounters:  02/10/20 184 lb 3.2 oz (83.6 kg)  11/03/19 181 lb 6.1 oz (82.3 kg)  12/06/18 185 lb (83.9 kg)  VITALS: BP 120/84   Pulse (!) 59   Ht 5\' 10"  (1.778 m)   Wt 184 lb 3.2 oz (83.6 kg)   SpO2 94%   BMI 26.43 kg/m   EXAM: General appearance: alert Neck: no carotid bruit, no JVD and thyroid not enlarged, symmetric, no tenderness/mass/nodules Lungs: clear to auscultation bilaterally Heart: regular rate and rhythm, S1, S2 normal, no murmur, click, rub or gallop Abdomen: soft, non-tender; bowel sounds normal; no masses,  no organomegaly Extremities: extremities normal, atraumatic, no cyanosis or edema Pulses: 2+ and symmetric Skin: Skin color, texture, turgor normal. No rashes or lesions Neurologic: Grossly normal Psych: Pleasant  EKG: Sinus bradycardia 59- personally reviewed  ASSESSMENT: 1. Progressive dyspnea on exertion 2. High CAC score 752, 87 percentile for age and sex matched control (11/2019) 3. Dyslipidemia goal LDL less than  70 4. History of possible myalgias on Lipitor 5. Family history of heart disease in his father  PLAN: 1.   Mr. Wernick had possible intolerance to Lipitor in the past.  He needs high potency statin therapy with a target LDL less than 70.  He was recently started on Crestor 40 mg daily by his PCP.  He may need additional lipid-lowering therapy, perhaps ezetimibe to target LDL less than 70.  In addition has had some progressive dyspnea on exertion which could be unstable angina.  Since he has heavy coronary calcification I recommend a Myoview stress test to further evaluate.  Plan follow-up with me in 2 months or sooner as necessary.  Pixie Casino, MD, [facility], [place] Director of the Advanced Lipid Disorders &  Cardiovascular Risk Reduction Clinic Diplomate of the American Board of Clinical Lipidology Attending Cardiologist  Direct Dial: [phone]  Fax: [phone]  Website:  [email or web address] Lambright 02/12/2020, 6:25 PM

## 2020-02-20 ENCOUNTER — Other Ambulatory Visit (HOSPITAL_COMMUNITY)
Admission: RE | Admit: 2020-02-20 | Discharge: 2020-02-20 | Disposition: A | Discharge status: Home / Self Care | Payer: Medicare HMO | Source: Ambulatory Visit | Attending: Internal Medicine | Admitting: Internal Medicine

## 2020-02-20 DIAGNOSIS — Z20822 Contact with and (suspected) exposure to covid-19: Secondary | ICD-10-CM | POA: Insufficient documentation

## 2020-02-20 DIAGNOSIS — Z01812 Encounter for preprocedural laboratory examination: Secondary | ICD-10-CM | POA: Diagnosis not present

## 2020-02-20 LAB — SARS CORONAVIRUS 2 (TAT 6-24 HRS): SARS Coronavirus 2: NEGATIVE

## 2020-02-21 ENCOUNTER — Telehealth (HOSPITAL_COMMUNITY): Payer: Self-pay

## 2020-02-21 NOTE — Telephone Encounter (Signed)
Encounter complete. 

## 2020-02-23 ENCOUNTER — Other Ambulatory Visit: Payer: Self-pay

## 2020-02-23 ENCOUNTER — Ambulatory Visit (HOSPITAL_COMMUNITY)
Admission: RE | Admit: 2020-02-23 | Discharge: 2020-02-23 | Disposition: A | Discharge status: Home / Self Care | Payer: Medicare HMO | Source: Ambulatory Visit | Attending: Internal Medicine | Admitting: Internal Medicine

## 2020-02-23 DIAGNOSIS — R0602 Shortness of breath: Secondary | ICD-10-CM | POA: Insufficient documentation

## 2020-02-23 DIAGNOSIS — E785 Hyperlipidemia, unspecified: Secondary | ICD-10-CM | POA: Diagnosis not present

## 2020-02-23 LAB — MYOCARDIAL PERFUSION IMAGING
Estimated workload: 7 METS
Exercise duration (min): 6 min
Exercise duration (sec): 0 s
LV dias vol: 120 mL (ref 62–150)
LV sys vol: 57 mL
MPHR: 153 {beats}/min
Peak HR: 151 {beats}/min
Percent HR: 98 %
Rest HR: 59 {beats}/min
SDS: 4
SRS: 0
SSS: 4
TID: 0.97

## 2020-02-23 MED ORDER — TECHNETIUM TC 99M TETROFOSMIN IV KIT
32.2000 | PACK | Freq: Once | INTRAVENOUS | Status: AC | PRN
  Administered 2020-02-23: 32.2 via INTRAVENOUS
  Filled 2020-02-23: qty 33

## 2020-02-23 MED ORDER — TECHNETIUM TC 99M TETROFOSMIN IV KIT
10.6000 | PACK | Freq: Once | INTRAVENOUS | Status: AC | PRN
  Administered 2020-02-23: 10.6 via INTRAVENOUS
  Filled 2020-02-23: qty 11

## 2020-02-29 ENCOUNTER — Telehealth: Payer: Self-pay | Admitting: Internal Medicine

## 2020-02-29 DIAGNOSIS — E785 Hyperlipidemia, unspecified: Secondary | ICD-10-CM

## 2020-02-29 NOTE — Telephone Encounter (Signed)
Pt c/o medication issue:  1. Name of Medication: rosuvastatin (CRESTOR) 40 MG tablet  2. How are you currently taking this medication (dosage and times per day)? As directed   3. Are you having a reaction (difficulty breathing--STAT)? yes  4. What is your medication issue? Achy feeling. Started in his legs and has gone up into his chest and upper body. He also feels very anxious and nervous. His wife was thinking this is a result of the medication he is on.   STAT if patient feels like he/she is going to faint   1) Are you dizzy now? yes  2) Do you feel faint or have you passed out?  no  3) Do you have any other symptoms? Weakness, achy feeling   4) Have you checked your HR and BP (record if available)? 117/80 last night

## 2020-02-29 NOTE — Telephone Encounter (Signed)
Spoke with pt, he was given the okay to stop the rosuvastatin for 2 weeks to see if his symptoms improve. He will call back after 2 weeks to let us know how he is doing.

## 2020-03-14 NOTE — Telephone Encounter (Signed)
Spoke with the patient who states that his soreness and aches in his legs have greatly improved since stopping rosuvastatin. He states that his anxiety has also approved. Advised that I would route to Dr. Debara Pickett for further recommendations on cholesterol medications.

## 2020-03-14 NOTE — Telephone Encounter (Signed)
Patient's wife calling to give update on stopping rosuvastatin.

## 2020-03-15 MED ORDER — PRALUENT 150 MG/ML ~~LOC~~ SOAJ: 1.0000 | SUBCUTANEOUS | 11 refills | Status: DC

## 2020-03-15 NOTE — Addendum Note (Signed)
Addended by: Fidel Levy on: 03/15/2020 03:46 PM   Modules accepted: Orders

## 2020-03-15 NOTE — Telephone Encounter (Signed)
PA for Praluent 150mg /mL submitted via CMM (Key: [id number])

## 2020-03-15 NOTE — Telephone Encounter (Signed)
Type of coverage approved: Prior Authorization This approval authorizes your coverage from 06/10/2019 - 06/08/2020,

## 2020-03-15 NOTE — Telephone Encounter (Signed)
Spoke with patient and discussed PCSK9i and he is willing to try this. He has [employer] Part D He has tried/failed: Pravastatin Atorvastatin Rosuvastatin All have cause myalgias, muscle aches  MyChart message sent to patient with link to [facility] website [facility]  Will update him when med is approved

## 2020-03-15 NOTE — Telephone Encounter (Signed)
Thanks Carlyle - Had side effects on Crestor as well as lipitor in the past - would recommend PCSK9i as he remains above target with known ASCVD. [place] my nurse Eliezer Lofts on this as well.  Dr. Lemmie Evens

## 2020-03-16 NOTE — Telephone Encounter (Signed)
Called Mike Hall and spoke with him and his wife today- answered all questions about the medications, side effects, use of Praluent, etc - he will keep follow-up in November - may have 2 shots by then, would be ok to re-check lipids in 4 weeks after 1st shot.  Dr Lemmie Evens

## 2020-03-27 DIAGNOSIS — R69 Illness, unspecified: Secondary | ICD-10-CM | POA: Diagnosis not present

## 2020-04-09 DIAGNOSIS — E785 Hyperlipidemia, unspecified: Secondary | ICD-10-CM | POA: Diagnosis not present

## 2020-04-09 LAB — LIPID PANEL
Chol/HDL Ratio: 3.2 ratio (ref 0.0–5.0)
Cholesterol, Total: 153 mg/dL (ref 100–199)
HDL: 48 mg/dL (ref >39)
LDL Chol Calc (NIH): 85 mg/dL (ref 0–99)
Triglycerides: 107 mg/dL (ref 0–149)
VLDL Cholesterol Cal: 20 mg/dL (ref 5–40)

## 2020-04-12 ENCOUNTER — Other Ambulatory Visit: Payer: Self-pay

## 2020-04-12 ENCOUNTER — Encounter: Payer: Self-pay | Admitting: Internal Medicine

## 2020-04-12 ENCOUNTER — Ambulatory Visit: Payer: Medicare HMO | Admitting: Internal Medicine

## 2020-04-12 VITALS — BP 140/86 | HR 68 | Ht 69.0 in | Wt 182.8 lb

## 2020-04-12 DIAGNOSIS — R931 Abnormal findings on diagnostic imaging of heart and coronary circulation: Secondary | ICD-10-CM

## 2020-04-12 DIAGNOSIS — E785 Hyperlipidemia, unspecified: Secondary | ICD-10-CM

## 2020-04-12 DIAGNOSIS — T466X5A Adverse effect of antihyperlipidemic and antiarteriosclerotic drugs, initial encounter: Secondary | ICD-10-CM | POA: Diagnosis not present

## 2020-04-12 DIAGNOSIS — M791 Myalgia, unspecified site: Secondary | ICD-10-CM

## 2020-04-12 DIAGNOSIS — R0602 Shortness of breath: Secondary | ICD-10-CM | POA: Diagnosis not present

## 2020-04-12 NOTE — Patient Instructions (Signed)
Medication Instructions:  Your physician recommends that you continue on your current medications as directed. Please refer to the Current Medication list given to you today.  *If you need a refill on your cardiac medications before your next appointment, please call your pharmacy*   Lab Work: FASTING LIPID PANEL in 6 months  If you have labs (blood work) drawn today and your tests are completely normal, you will receive your results only by: Marland Kitchen MyChart Message (if you have MyChart) OR . A paper copy in the mail If you have any lab test that is abnormal or we need to change your treatment, we will call you to review the results.   Testing/Procedures: NONE   Follow-Up: At [facility], you and your health needs are our priority.  As part of our continuing mission to provide you with exceptional heart care, we have created designated Provider Care Teams.  These Care Teams include your primary Cardiologist (physician) and Advanced Practice Providers (APPs -  Physician Assistants and Nurse Practitioners) who all work together to provide you with the care you need, when you need it.  We recommend signing up for the patient portal called "MyChart".  Sign up information is provided on this After Visit Summary.  MyChart is used to connect with patients for Virtual Visits (Telemedicine).  Patients are able to view lab/test results, encounter notes, upcoming appointments, etc.  Non-urgent messages can be sent to your provider as well.   To learn more about what you can do with MyChart, go to [email or web address].    Your next appointment:   6 month(s)  The format for your next appointment:   In Person  Provider:   K. Mali Hilty, MD   Other Instructions

## 2020-04-12 NOTE — Progress Notes (Signed)
LIPID CLINIC CONSULT NOTE  Chief Complaint:  Follow-up dyslipidemia  Primary Care Physician: Marin Olp, MD  Primary Cardiologist:  No primary care provider on file.  HPI:  Mike Hall is a 70 y.o. male who is being seen today for the evaluation of dyslipidemia at the request of Marin Olp, MD.  This is a pleasant 69 year old kindly referred for management of dyslipidemia.  He has a family history of heart disease in his father who had MI at age 4.  Recent labs showed a total cholesterol 157, triglycerides 83, HDL 48 and LDL of 93.  He did undergo CT coronary calcium scoring in June 2021 showing total calcium score of 752, 87th percentile for age and sex matched control.  Calcification was noted in the LAD and circumflex distributions.  Currently he reports some dyspnea and possibly some progressive fatigue.  Is been present over the past 6 months or so.  It may be slightly worse since he realized that he had some coronary artery disease.  04/12/2020  Mike Hall returns today for follow-up of dyslipidemia.  He has had an excellent response to his lipids.  He is currently on rosuvastatin 40 mg daily.  His total cholesterol is 157, triglycerides 107, HDL 48 and LDL of 85.  This is down from an LDL of 157 in the past.  His target is less than 70 and I do think we could possibly achieve that with additional titration of his statin.  With regards to some dyspnea he was having during his previous office visit and his high coronary calcium score, I recommended a nuclear stress test.  This was performed on February 23, 2020 and did not demonstrate any reversible ischemia.  Since then he reports his dyspnea has improved.  PMHx:  Past Medical History:  Diagnosis Date  . Hyperlipidemia   . Pre-syncope     Past Surgical History:  Procedure Laterality Date  . none      FAMHx:  Family History  Problem Relation Age of Onset  . Uterine cancer Mother        45  . Coronary  artery disease Father        age 80  . Diabetes Mellitus I Daughter   . Hypertension Brother   . Colon cancer Neg Hx     SOCHx:   reports that he has never smoked. He has never used smokeless tobacco. He reports that he does not drink alcohol and does not use drugs.  ALLERGIES:  No Known Allergies  ROS: Pertinent items noted in HPI and remainder of comprehensive ROS otherwise negative.  HOME MEDS: Current Outpatient Medications on File Prior to Visit  Medication Sig Dispense Refill  . Alirocumab (PRALUENT) 150 MG/ML SOAJ Inject 1 Dose into the skin every 14 (fourteen) days. 2 mL 11  . aspirin EC 81 MG tablet Take 81 mg by mouth daily. Swallow whole.    . cholecalciferol (VITAMIN D3) 25 MCG (1000 UT) tablet Take 2,000 Units by mouth daily.     . DENTA 5000 PLUS 1.1 % CREA dental cream USE TO BRUSH TEETH AT BEDTIME, SPIT OUT, THEN FLOSS. DO NOT EAT, DRINK, OR RINSE FOR 30 MINUTES  11  . Magnesium Gluconate (MAGONATE PO) Take 1,200 mg by mouth.     . rosuvastatin (CRESTOR) 40 MG tablet Take 1 tablet (40 mg total) by mouth daily. 90 tablet 3  . STUDY - ARCADIA - aspirin 81mg  or placebo (PI - Sethi)  No current facility-administered medications on file prior to visit.    LABS/IMAGING: No results found for this or any previous visit (from the past 48 hour(s)). No results found.  LIPID PANEL:    Component Value Date/Time   CHOL 153 04/09/2020 1003   CHOL 189 12/20/2012 1541   TRIG 107 04/09/2020 1003   TRIG 278 (H) 05/16/2013 1514   TRIG 164 (H) 12/20/2012 1541   HDL 48 04/09/2020 1003   HDL 37 (L) 05/16/2013 1514   HDL 41 12/20/2012 1541   CHOLHDL 3.2 04/09/2020 1003   CHOLHDL 6 11/03/2019 0940   VLDL 26.0 11/03/2019 0940   LDLCALC 85 04/09/2020 1003   LDLCALC 54 05/16/2013 1514   LDLCALC 115 (H) 12/20/2012 1541    WEIGHTS: Wt Readings from Last 3 Encounters:  04/12/20 182 lb 12.8 oz (82.9 kg)  02/23/20 184 lb (83.5 kg)  02/10/20 184 lb 3.2 oz (83.6 kg)     VITALS: BP 140/86   Pulse 68   Ht 5\' 9"  (1.753 m)   Wt 182 lb 12.8 oz (82.9 kg)   SpO2 99%   BMI 26.99 kg/m   EXAM: Deferred  EKG: Deferred  ASSESSMENT: 1. Progressive dyspnea on exertion -low risk Myoview stress test without ischemia (02/2020) 2. High CAC score 752, 87 percentile for age and sex matched control (11/2019) 3. Dyslipidemia goal LDL less than 70 4. History of possible myalgias on Lipitor 5. Family history of heart disease in his father  PLAN: 1.   Mike Hall seems to be tolerating high potency rosuvastatin.  I would recommend a further increase up to 80 mg daily and repeat lipids in 3 months to see if we have achieved a target LDL less than 70.  This is based on very high coronary calcium score.  He did have recent Myoview stress testing however without ischemia and reports improvement in some of his symptoms of dyspnea.  He should continue to remain active and monitor for signs or symptoms of ischemia.  Follow-up with me in 6 months.  Pixie Casino, MD, [facility], [place] Director of the Advanced Lipid Disorders &  Cardiovascular Risk Reduction Clinic Diplomate of the American Board of Clinical Lipidology Attending Cardiologist  Direct Dial: [phone]  Fax: [phone]  Website:  [email or web address] 04/12/2020, 9:25 AM

## 2020-04-14 ENCOUNTER — Encounter: Payer: Self-pay | Admitting: Internal Medicine

## 2020-05-22 ENCOUNTER — Telehealth: Payer: Self-pay | Admitting: Internal Medicine

## 2020-05-22 NOTE — Telephone Encounter (Signed)
Attempted PA for praluent via [place] Message received: The patient currently has access to the requested medication and a Prior Authorization is not needed for the patient/medication

## 2020-05-29 DIAGNOSIS — D225 Melanocytic nevi of trunk: Secondary | ICD-10-CM | POA: Diagnosis not present

## 2020-05-29 DIAGNOSIS — L578 Other skin changes due to chronic exposure to nonionizing radiation: Secondary | ICD-10-CM | POA: Diagnosis not present

## 2020-05-29 DIAGNOSIS — Z86018 Personal history of other benign neoplasm: Secondary | ICD-10-CM | POA: Diagnosis not present

## 2020-05-29 DIAGNOSIS — L570 Actinic keratosis: Secondary | ICD-10-CM | POA: Diagnosis not present

## 2020-05-29 DIAGNOSIS — L821 Other seborrheic keratosis: Secondary | ICD-10-CM | POA: Diagnosis not present

## 2020-07-18 ENCOUNTER — Encounter: Payer: Self-pay | Admitting: Gastroenterology

## 2020-07-28 IMAGING — CT CT CARDIAC CORONARY ARTERY CALCIUM SCORE
3 of 5 series · 10 of 20 positions shown, 11 images · non-contrast
Comparison: None.
COMPARISON: None.

Addendum:
EXAM:
OVER-READ INTERPRETATION  CT CHEST

The following report is an over-read performed by radiologist Dr.
Danial Macas [REDACTED] on 11/17/2019. This
over-read does not include interpretation of cardiac or coronary
anatomy or pathology. The coronary calcium score interpretation by
the cardiologist is attached.
CLINICAL DATA: 68M for risk stratification
Coronary Calcium Score
TECHNIQUE: The patient was scanned on a Siemens Force scanner. Axial
non-contrast 3 mm slices were carried out through the heart. The
data set was analyzed on a dedicated work station and scored using
the Agatson method.

[Series 2: casc 3.0 bv41 2 bestdiast 70 % · axial · 0.40mm/px · z∈[+1276,+1339]mm · 4 of 37 slices shown, 5 images]
[im 8/37  vessel]
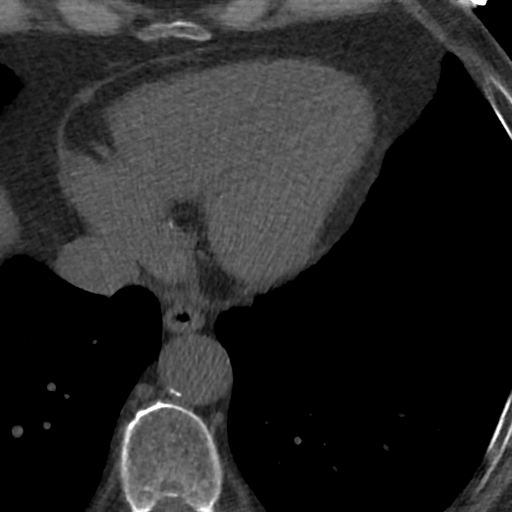
[im 8/37  lung]
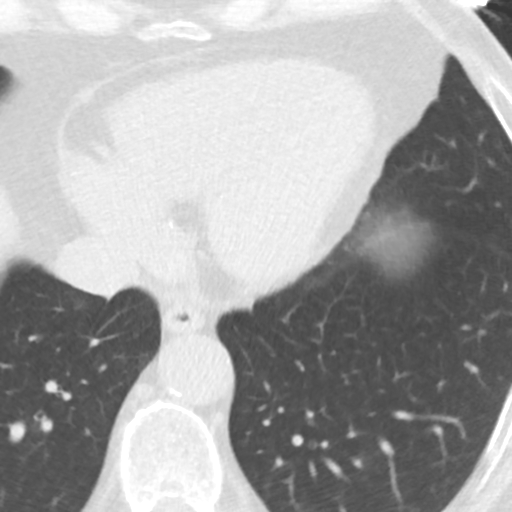
[im 15/37  vessel]
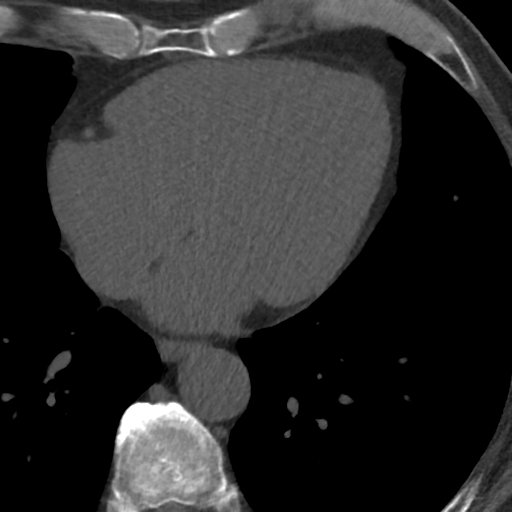
[im 22/37  vessel]
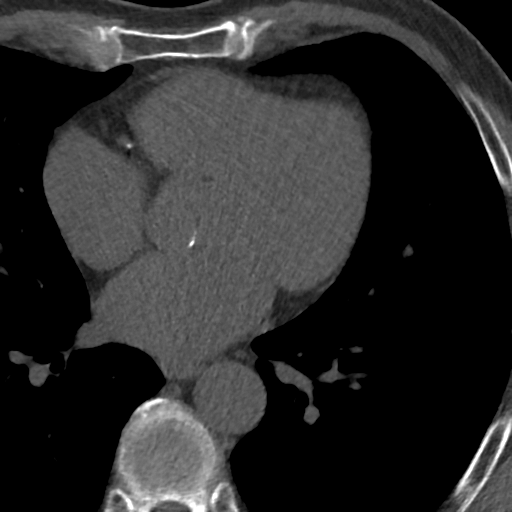
[im 29/37  vessel]
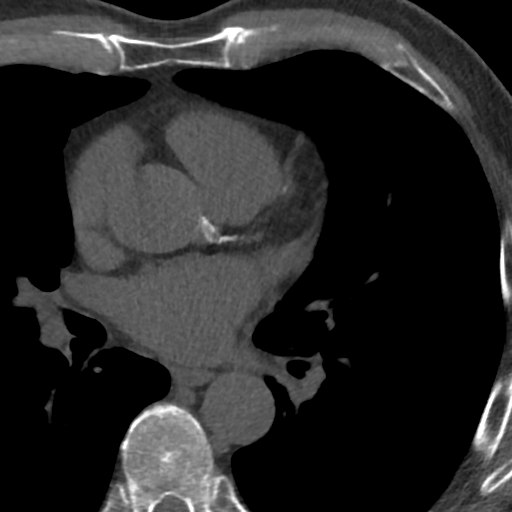

[Series 7: lung st full 70 % · axial · 0.46mm/px · z∈[+1282,+1336]mm · 3 of 37 slices shown]
[im 10/37  lung]
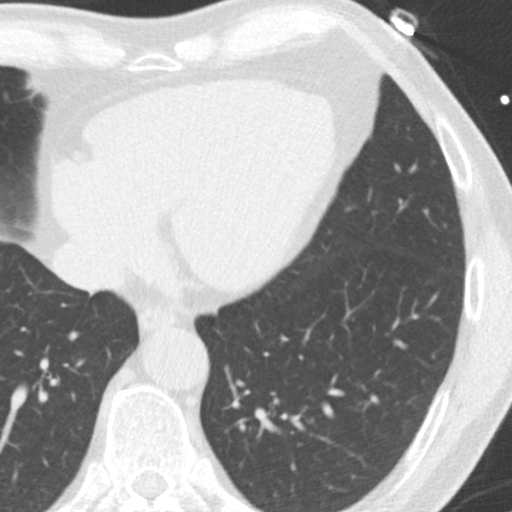
[im 19/37  lung]
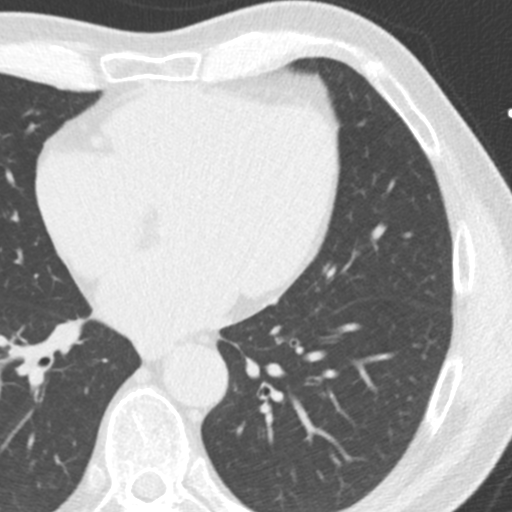
[im 28/37  lung]
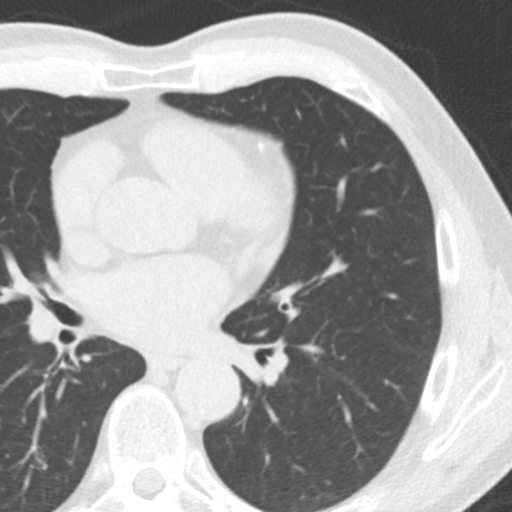

[Series 8: lung full 70 % · axial · 0.68mm/px · z∈[+1282,+1336]mm · 3 of 37 slices shown]
[im 10/37  lung]
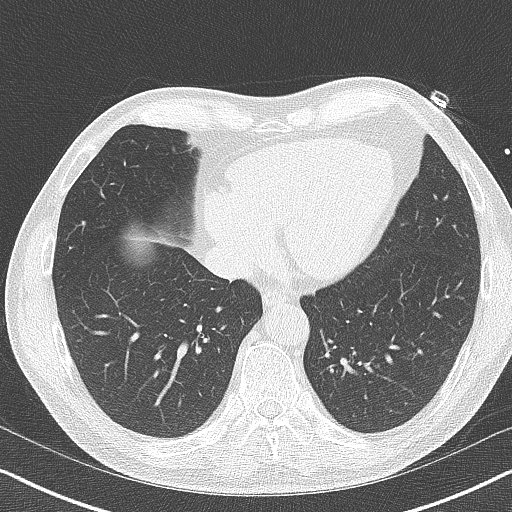
[im 19/37  lung]
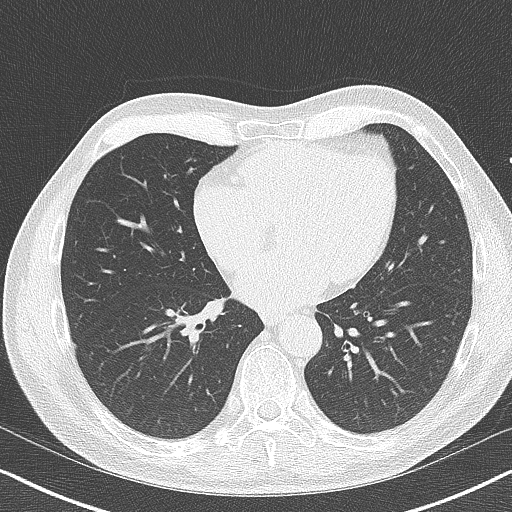
[im 28/37  lung]
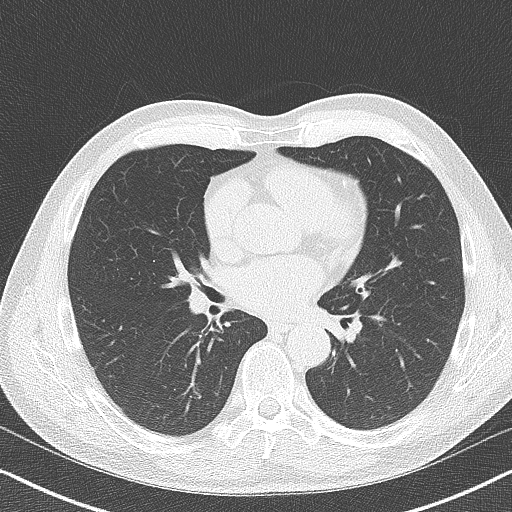

[10 of 20 positions shown; findings below may reference images not displayed]

FINDINGS: Aortic atherosclerosis. Within the visualized portions of the thorax
there are no suspicious appearing pulmonary nodules or masses, there
is no acute consolidative airspace disease, no pleural effusions, no
pneumothorax and no lymphadenopathy. Visualized portions of the
upper abdomen demonstrates an incompletely imaged low-attenuation
lesion measuring 2.2 cm in diameter in segment 4A, incompletely
characterized on today's non-contrast CT examination, but
statistically likely to represent a cyst. There are no aggressive
appearing lytic or blastic lesions noted in the visualized portions
of the skeleton.
IMPRESSION: 1.  Aortic Atherosclerosis (MEDR0-5KM.M).
FINDINGS: Non-cardiac: See separate report from [REDACTED].

Aorta: Ascending aorta mildly dilated. 3.9 cm. Milld calcification
of the aortic root and descending aorta.

Pericardium: Normal

Coronary arteries: Normal origins. R dominant. Calcification noted
predominantly in the LAD and RCA distributions.
IMPRESSION: Coronary calcium score of 752. This was 87th percentile for age and
sex matched control.

Calcification noted in the LAD and LCX distributions.

Recommend aggressive risk factor modification including high potency
statin.

*** End of Addendum ***
EXAM:
OVER-READ INTERPRETATION  CT CHEST

The following report is an over-read performed by radiologist Dr.
Danial Macas [REDACTED] on 11/17/2019. This
over-read does not include interpretation of cardiac or coronary
anatomy or pathology. The coronary calcium score interpretation by
the cardiologist is attached.
FINDINGS: Aortic atherosclerosis. Within the visualized portions of the thorax
there are no suspicious appearing pulmonary nodules or masses, there
is no acute consolidative airspace disease, no pleural effusions, no
pneumothorax and no lymphadenopathy. Visualized portions of the
upper abdomen demonstrates an incompletely imaged low-attenuation
lesion measuring 2.2 cm in diameter in segment 4A, incompletely
characterized on today's non-contrast CT examination, but
statistically likely to represent a cyst. There are no aggressive
appearing lytic or blastic lesions noted in the visualized portions
of the skeleton.
IMPRESSION: 1.  Aortic Atherosclerosis (MEDR0-5KM.M).

## 2020-07-31 ENCOUNTER — Encounter: Payer: Self-pay | Admitting: Gastroenterology

## 2020-09-06 DIAGNOSIS — E785 Hyperlipidemia, unspecified: Secondary | ICD-10-CM | POA: Diagnosis not present

## 2020-09-06 DIAGNOSIS — R030 Elevated blood-pressure reading, without diagnosis of hypertension: Secondary | ICD-10-CM | POA: Diagnosis not present

## 2020-09-07 ENCOUNTER — Other Ambulatory Visit: Payer: Self-pay | Admitting: *Deleted

## 2020-09-27 ENCOUNTER — Telehealth: Payer: Self-pay | Admitting: Gastroenterology

## 2020-09-27 ENCOUNTER — Other Ambulatory Visit: Payer: Self-pay

## 2020-09-27 ENCOUNTER — Encounter: Payer: Self-pay | Admitting: Gastroenterology

## 2020-09-27 ENCOUNTER — Ambulatory Visit (AMBULATORY_SURGERY_CENTER): Payer: Self-pay | Admitting: *Deleted

## 2020-09-27 VITALS — Ht 69.0 in | Wt 184.0 lb

## 2020-09-27 DIAGNOSIS — Z86010 Personal history of colonic polyps: Secondary | ICD-10-CM

## 2020-09-27 MED ORDER — NA SULFATE-K SULFATE-MG SULF 17.5-3.13-1.6 GM/177ML PO SOLN: ORAL | 0 refills | Status: DC

## 2020-09-27 NOTE — Telephone Encounter (Signed)
Pt's wife called to inform that prep was sent to incorrect pharmacy. [facility] [place]

## 2020-09-27 NOTE — Telephone Encounter (Signed)
[facility]

## 2020-09-27 NOTE — Progress Notes (Signed)
Patient and wife is here in-person for PV. Patient denies any allergies to eggs or soy. Patient denies any problems with sedation. NO past surgeries. Patient denies any oxygen use at home. Patient denies taking any diet/weight loss medications or blood thinners. Patient is not being treated for MRSA or C-diff. Patient is aware of our care-partner policy and HALPF-79 safety protocol. EMMI education assigned to the patient for the procedure, sent to [place].   Patient is  COVID-19 vaccinated, per patient. Patient does not want to do the [place], suprep used (he did that last time)-pt aware of cost.

## 2020-10-03 DIAGNOSIS — E785 Hyperlipidemia, unspecified: Secondary | ICD-10-CM | POA: Diagnosis not present

## 2020-10-03 LAB — LIPID PANEL
Chol/HDL Ratio: 3 ratio (ref 0.0–5.0)
Cholesterol, Total: 149 mg/dL (ref 100–199)
HDL: 50 mg/dL (ref >39)
LDL Chol Calc (NIH): 84 mg/dL (ref 0–99)
Triglycerides: 78 mg/dL (ref 0–149)
VLDL Cholesterol Cal: 15 mg/dL (ref 5–40)

## 2020-10-09 ENCOUNTER — Ambulatory Visit: Payer: Medicare HMO | Admitting: Internal Medicine

## 2020-10-11 ENCOUNTER — Encounter: Payer: Self-pay | Admitting: Gastroenterology

## 2020-10-11 ENCOUNTER — Ambulatory Visit (AMBULATORY_SURGERY_CENTER): Payer: Medicare HMO | Admitting: Gastroenterology

## 2020-10-11 VITALS — BP 116/67 | HR 63 | Temp 97.1°F | Resp 15 | Ht 69.0 in | Wt 184.0 lb

## 2020-10-11 DIAGNOSIS — Z86010 Personal history of colonic polyps: Secondary | ICD-10-CM | POA: Diagnosis not present

## 2020-10-11 DIAGNOSIS — D123 Benign neoplasm of transverse colon: Secondary | ICD-10-CM

## 2020-10-11 DIAGNOSIS — E785 Hyperlipidemia, unspecified: Secondary | ICD-10-CM | POA: Diagnosis not present

## 2020-10-11 DIAGNOSIS — E669 Obesity, unspecified: Secondary | ICD-10-CM | POA: Diagnosis not present

## 2020-10-11 MED ORDER — SODIUM CHLORIDE 0.9 % IV SOLN: 500.0000 mL | Freq: Once | INTRAVENOUS | Status: DC

## 2020-10-11 NOTE — Progress Notes (Signed)
IV dripping from pre-op, but upon induction, pt stated it was urning but not feeling sedated.  Slight "bump" felt above site and area was sore to touch.  That IV d/c'd.  New one started in left wrist by SM with 24 g (Unsuccessful prior attempts by JMx2, MOx1 (all 3 had flash back but would not thread).  Warm compress applied to infiltrate site

## 2020-10-11 NOTE — Progress Notes (Signed)
Report to PACU, RN, vss, BBS= Clear.  

## 2020-10-11 NOTE — Patient Instructions (Signed)
Handout on polyps given. ° °YOU HAD AN ENDOSCOPIC PROCEDURE TODAY AT THE [facility]:   Refer to the procedure report that was given to you for any specific questions about what was found during the examination.  If the procedure report does not answer your questions, please call your gastroenterologist to clarify.  If you requested that your care partner not be given the details of your procedure findings, then the procedure report has been included in a sealed envelope for you to review at your convenience later. ° °YOU SHOULD EXPECT: Some feelings of bloating in the abdomen. Passage of more gas than usual.  Walking can help get rid of the air that was put into your GI tract during the procedure and reduce the bloating. If you had a lower endoscopy (such as a colonoscopy or flexible sigmoidoscopy) you may notice spotting of blood in your stool or on the toilet paper. If you underwent a bowel prep for your procedure, you may not have a normal bowel movement for a few days. ° °Please Note:  You might notice some irritation and congestion in your nose or some drainage.  This is from the oxygen used during your procedure.  There is no need for concern and it should clear up in a day or so. ° °SYMPTOMS TO REPORT IMMEDIATELY: ° °Following lower endoscopy (colonoscopy or flexible sigmoidoscopy): ° Excessive amounts of blood in the stool ° Significant tenderness or worsening of abdominal pains ° Swelling of the abdomen that is new, acute ° Fever of 100°F or higher ° °For urgent or emergent issues, a gastroenterologist can be reached at any hour by calling [phone]. °Do not use MyChart messaging for urgent concerns.  ° ° °DIET:  We do recommend a small meal at first, but then you may proceed to your regular diet.  Drink plenty of fluids but you should avoid alcoholic beverages for 24 hours. ° °ACTIVITY:  You should plan to take it easy for the rest of today and you should NOT DRIVE or use heavy machinery  until tomorrow (because of the sedation medicines used during the test).   ° °FOLLOW UP: °Our staff will call the number listed on your records 48-72 hours following your procedure to check on you and address any questions or concerns that you may have regarding the information given to you following your procedure. If we do not reach you, we will leave a message.  We will attempt to reach you two times.  During this call, we will ask if you have developed any symptoms of COVID 19. If you develop any symptoms (ie: fever, flu-like symptoms, shortness of breath, cough etc.) before then, please call [phone].  If you test positive for Covid 19 in the 2 weeks post procedure, please call and report this information to us.   ° °If any biopsies were taken you will be contacted by phone or by letter within the next 1-3 weeks.  Please call us at [phone] if you have not heard about the biopsies in 3 weeks.  ° ° °SIGNATURES/CONFIDENTIALITY: °You and/or your care partner have signed paperwork which will be entered into your electronic medical record.  These signatures attest to the fact that that the information above on your After Visit Summary has been reviewed and is understood.  Full responsibility of the confidentiality of this discharge information lies with you and/or your care-partner.  °

## 2020-10-11 NOTE — Op Note (Signed)
Mike Hall Patient Name: Mike Hall Procedure Date: 10/11/2020 8:33 AM MRN: [id number] Endoscopist: Ladene Artist , MD Age: 70 Referring MD:  Date of Birth: [birth year] Gender: Male Account #: [id number] Procedure:                Colonoscopy Indications:              Surveillance: Personal history of adenomatous                            polyps on last colonoscopy > 5 years ago Medicines:                Monitored Anesthesia Care Procedure:                Pre-Anesthesia Assessment:                           - Prior to the procedure, a History and Physical                            was performed, and patient medications and                            allergies were reviewed. The patient's tolerance of                            previous anesthesia was also reviewed. The risks                            and benefits of the procedure and the sedation                            options and risks were discussed with the patient.                            All questions were answered, and informed consent                            was obtained. Prior Anticoagulants: The patient has                            taken no previous anticoagulant or antiplatelet                            agents. ASA Grade Assessment: II - A patient with                            mild systemic disease. After reviewing the risks                            and benefits, the patient was deemed in                            satisfactory condition to undergo the procedure.  After obtaining informed consent, the colonoscope                            was passed under direct vision. Throughout the                            procedure, the patient's blood pressure, pulse, and                            oxygen saturations were monitored continuously. The                            Olympus [id number]) [id number] was introduced                            through the anus and  advanced to the the cecum,                            identified by appendiceal orifice and ileocecal                            valve. The ileocecal valve, appendiceal orifice,                            and rectum were photographed. The quality of the                            bowel preparation was good. The colonoscopy was                            performed without difficulty. The patient tolerated                            the procedure well. Scope In: 9:05:21 AM Scope Out: 9:21:47 AM Scope Withdrawal Time: 0 hours 13 minutes 58 seconds  Total Procedure Duration: 0 hours 16 minutes 26 seconds  Findings:                 The perianal and digital rectal examinations were                            normal.                           Three sessile polyps were found in the transverse                            colon. The polyps were 5 to 8 mm in size. These                            polyps were removed with a cold snare. Resection                            and retrieval were complete.  Internal hemorrhoids were found during                            retroflexion. The hemorrhoids were moderate and                            Grade I (internal hemorrhoids that do not prolapse).                           The exam was otherwise without abnormality on                            direct and retroflexion views. Complications:            No immediate complications. Estimated blood loss:                            None. Estimated Blood Loss:     Estimated blood loss: none. Impression:               - Three 5 to 8 mm polyps in the transverse colon,                            removed with a cold snare. Resected and retrieved.                           - Internal hemorrhoids.                           - The examination was otherwise normal on direct                            and retroflexion views. Recommendation:           - Repeat colonoscopy date to be determined  after                            pending pathology results are reviewed for                            surveillance based on pathology results.                           - Patient has a contact number available for                            emergencies. The signs and symptoms of potential                            delayed complications were discussed with the                            patient. Return to normal activities tomorrow.                            Written discharge instructions were provided to  the                            patient.                           - Resume previous diet.                           - Continue present medications.                           - Await pathology results. Ladene Artist, MD 10/11/2020 9:24:05 AM This report has been signed electronically.

## 2020-10-11 NOTE — Progress Notes (Signed)
Called to room to assist during endoscopic procedure.  Patient ID and intended procedure confirmed with present staff. Received instructions for my participation in the procedure from the performing physician.  

## 2020-10-15 ENCOUNTER — Telehealth: Payer: Self-pay

## 2020-10-15 ENCOUNTER — Ambulatory Visit: Payer: Medicare HMO | Admitting: Internal Medicine

## 2020-10-15 ENCOUNTER — Other Ambulatory Visit: Payer: Self-pay

## 2020-10-15 ENCOUNTER — Encounter: Payer: Self-pay | Admitting: Internal Medicine

## 2020-10-15 VITALS — BP 120/86 | HR 60 | Ht 70.0 in | Wt 183.6 lb

## 2020-10-15 DIAGNOSIS — M791 Myalgia, unspecified site: Secondary | ICD-10-CM | POA: Diagnosis not present

## 2020-10-15 DIAGNOSIS — R931 Abnormal findings on diagnostic imaging of heart and coronary circulation: Secondary | ICD-10-CM

## 2020-10-15 DIAGNOSIS — E785 Hyperlipidemia, unspecified: Secondary | ICD-10-CM | POA: Diagnosis not present

## 2020-10-15 DIAGNOSIS — T466X5D Adverse effect of antihyperlipidemic and antiarteriosclerotic drugs, subsequent encounter: Secondary | ICD-10-CM

## 2020-10-15 MED ORDER — ROSUVASTATIN CALCIUM 5 MG PO TABS: 5.0000 mg | ORAL_TABLET | Freq: Every day | ORAL | 3 refills | Status: DC

## 2020-10-15 MED ORDER — ROSUVASTATIN CALCIUM 5 MG PO TABS: 5.0000 mg | ORAL_TABLET | ORAL | 3 refills | Status: DC

## 2020-10-15 NOTE — Patient Instructions (Signed)
Medication Instructions: START crestor 5mg  every other day   *If you need a refill on your cardiac medications before your next appointment, please call your pharmacy*   Lab Work: FASTING lipid panel in 3-4 months (complete about 1 week before your next visit)  If you have labs (blood work) drawn today and your tests are completely normal, you will receive your results only by: Marland Kitchen MyChart Message (if you have MyChart) OR . A paper copy in the mail If you have any lab test that is abnormal or we need to change your treatment, we will call you to review the results.   Testing/Procedures: NONE   Follow-Up: At [facility], you and your health needs are our priority.  As part of our continuing mission to provide you with exceptional heart care, we have created designated Provider Care Teams.  These Care Teams include your primary Cardiologist (physician) and Advanced Practice Providers (APPs -  Physician Assistants and Nurse Practitioners) who all work together to provide you with the care you need, when you need it.  We recommend signing up for the patient portal called "MyChart".  Sign up information is provided on this After Visit Summary.  MyChart is used to connect with patients for Virtual Visits (Telemedicine).  Patients are able to view lab/test results, encounter notes, upcoming appointments, etc.  Non-urgent messages can be sent to your provider as well.   To learn more about what you can do with MyChart, go to [email or web address].    Your next appointment:   3-4 month(s)  The format for your next appointment:   In Person  Provider:   K. Mali Hilty, MD   [facility]  [address] [place], [place] [address] [place], [place] [address]    Other Instructions  Patient Assistance:  The [employer] foundation offers assistance to help pay for medication copays.  They will cover copays for all cholesterol lowering meds, including  statins, fibrates, omega-3 oils, ezetimibe, Repatha, Praluent, Nexletol, Nexlizet.  The cards are usually good for $2,500 or 12 months, whichever comes first. 1. Go to healthwellfoundation.org 2. Click on "Apply Now" 3. Answer questions as to whom is applying (patient or representative) 4. Your disease fund will be "hypercholesterolemia - Medicare access" 5. They will ask questions about finances and which medications you are taking for cholesterol 6. When you submit, the approval is usually within minutes.  You will need to print the card information from the site 7. You will need to show this information to your pharmacy, they will bill your Medicare Part D plan first -then bill [employer] --for the copay.   You can also call them at [phone], although the hold times can be quite long.   Thank you for choosing [facility]

## 2020-10-15 NOTE — Telephone Encounter (Signed)
  Follow up Call-  Call back number 10/11/2020  Post procedure Call Back phone  # [phone] wife  Permission to leave phone message Yes  Some recent data might be hidden     Patient questions:  Do you have a fever, pain , or abdominal swelling? No. Pain Score  0 *  Have you tolerated food without any problems? Yes.    Have you been able to return to your normal activities? Yes.    Do you have any questions about your discharge instructions: Diet   No. Medications  No. Follow up visit  No.  Do you have questions or concerns about your Care? No.  Actions: * If pain score is 4 or above: No action needed, pain <4.

## 2020-10-15 NOTE — Progress Notes (Signed)
LIPID CLINIC CONSULT NOTE  Chief Complaint:  Follow-up dyslipidemia  Primary Care Physician: Marin Olp, MD  Primary Cardiologist:  None  HPI:  Mike Hall is a 70 y.o. male who is being seen today for the evaluation of dyslipidemia at the request of Marin Olp, MD.  This is a pleasant 70 year old kindly referred for management of dyslipidemia.  He has a family history of heart disease in his father who had MI at age 72.  Recent labs showed a total cholesterol 157, triglycerides 83, HDL 48 and LDL of 93.  He did undergo CT coronary calcium scoring in June 2021 showing total calcium score of 752, 87th percentile for age and sex matched control.  Calcification was noted in the LAD and circumflex distributions.  Currently he reports some dyspnea and possibly some progressive fatigue.  Is been present over the past 6 months or so.  It may be slightly worse since he realized that he had some coronary artery disease.  04/12/2020  Mike Hall returns today for follow-up of dyslipidemia.  He has had an excellent response to his lipids.  He is currently on rosuvastatin 40 mg daily.  His total cholesterol is 157, triglycerides 107, HDL 48 and LDL of 85.  This is down from an LDL of 157 in the past.  His target is less than 70 and I do think we could possibly achieve that with additional titration of his statin.  With regards to some dyspnea he was having during his previous office visit and his high coronary calcium score, I recommended a nuclear stress test.  This was performed on February 23, 2020 and did not demonstrate any reversible ischemia.  Since then he reports his dyspnea has improved.  10/15/2020  Mike Hall returns today for follow-up.  He says he has been doing very well on Praluent.  He says is better tolerated than the statin and he denies any other side effects he was previously having on higher dose rosuvastatin.  Repeat lipids now showed total cholesterol 149, HDL 50,  LDL 84 and triglycerides 78.  Based on his high calcium score, his target LDL is less than 70.  We have not yet achieved that.  PMHx:  Past Medical History:  Diagnosis Date  . Hyperlipidemia   . Pre-syncope     Past Surgical History:  Procedure Laterality Date  . COLONOSCOPY  04/18/2015   Stark-polyp  . none    . POLYPECTOMY      FAMHx:  Family History  Problem Relation Age of Onset  . Uterine cancer Mother        46  . Coronary artery disease Father        age 56  . Diabetes Mellitus I Daughter   . Hypertension Brother   . Colon cancer Neg Hx   . Colon polyps Neg Hx   . Esophageal cancer Neg Hx   . Rectal cancer Neg Hx   . Stomach cancer Neg Hx     SOCHx:   reports that he has never smoked. He has never used smokeless tobacco. He reports that he does not drink alcohol and does not use drugs.  ALLERGIES:  No Known Allergies  ROS: Pertinent items noted in HPI and remainder of comprehensive ROS otherwise negative.  HOME MEDS: Current Outpatient Medications on File Prior to Visit  Medication Sig Dispense Refill  . Alirocumab (PRALUENT) 150 MG/ML SOAJ Inject 1 Dose into the skin every 14 (fourteen) days. 2  mL 11  . aspirin EC 81 MG tablet Take 81 mg by mouth daily. Swallow whole.    . cholecalciferol (VITAMIN D3) 25 MCG (1000 UT) tablet Take 2,000 Units by mouth daily.     . Magnesium Gluconate (MAGONATE PO) Take 800 mg by mouth.     No current facility-administered medications on file prior to visit.    LABS/IMAGING: No results found for this or any previous visit (from the past 48 hour(s)). No results found.  LIPID PANEL:    Component Value Date/Time   CHOL 149 10/03/2020 0903   CHOL 189 12/20/2012 1541   TRIG 78 10/03/2020 0903   TRIG 278 (H) 05/16/2013 1514   TRIG 164 (H) 12/20/2012 1541   HDL 50 10/03/2020 0903   HDL 37 (L) 05/16/2013 1514   HDL 41 12/20/2012 1541   CHOLHDL 3.0 10/03/2020 0903   CHOLHDL 6 11/03/2019 0940   VLDL 26.0 11/03/2019  0940   LDLCALC 84 10/03/2020 0903   LDLCALC 54 05/16/2013 1514   LDLCALC 115 (H) 12/20/2012 1541    WEIGHTS: Wt Readings from Last 3 Encounters:  10/15/20 183 lb 9.6 oz (83.3 kg)  10/11/20 184 lb (83.5 kg)  09/27/20 184 lb (83.5 kg)    VITALS: BP 120/86   Pulse 60   Ht 5\' 10"  (1.778 m)   Wt 183 lb 9.6 oz (83.3 kg)   BMI 26.34 kg/m   EXAM: General appearance: alert and no distress Lungs: clear to auscultation bilaterally Heart: regular rate and rhythm, S1, S2 normal, no murmur, click, rub or gallop Extremities: extremities normal, atraumatic, no cyanosis or edema Pulses: 2+ and symmetric Neurologic: Grossly normal  EKG: Normal sinus rhythm at 60-personally  ASSESSMENT: 1. Progressive dyspnea on exertion -low risk Myoview stress test without ischemia (02/2020) 2. High CAC score 752, 87 percentile for age and sex matched control (11/2019) 3. Dyslipidemia goal LDL less than 70 4. History of possible myalgias on Lipitor 5. Family history of heart disease in his father  PLAN: 1.   Mike Hall has a persistently elevated cholesterol despite being on the Praluent.  Fortunately he is tolerating that well.  He was having some issues on higher dose rosuvastatin.  I would like to try lower dose rosuvastatin 5 mg every other day for additional lipid lowering.  He will continue with the Praluent and hopefully reach a target on this therapy.  Repeat lipids in 3 months.  Pixie Casino, MD, [facility], [place] Director of the Advanced Lipid Disorders &  Cardiovascular Risk Reduction Clinic Diplomate of the American Board of Clinical Lipidology Attending Cardiologist  Direct Dial: [phone]  Fax: [phone]  Website:  [email or web address]  [place] 10/15/2020, 10:43 AM

## 2020-10-25 ENCOUNTER — Encounter: Payer: Self-pay | Admitting: Gastroenterology

## 2020-11-02 ENCOUNTER — Encounter: Payer: Medicare HMO | Admitting: Family Medicine

## 2020-11-05 ENCOUNTER — Encounter: Payer: Medicare HMO | Admitting: Family Medicine

## 2020-11-06 ENCOUNTER — Other Ambulatory Visit: Payer: Self-pay

## 2020-11-06 ENCOUNTER — Encounter: Payer: Self-pay | Admitting: Family Medicine

## 2020-11-06 ENCOUNTER — Ambulatory Visit (INDEPENDENT_AMBULATORY_CARE_PROVIDER_SITE_OTHER): Payer: Medicare HMO | Admitting: Family Medicine

## 2020-11-06 VITALS — BP 110/72 | HR 58 | Temp 98.2°F | Ht 70.0 in | Wt 179.2 lb

## 2020-11-06 DIAGNOSIS — Z23 Encounter for immunization: Secondary | ICD-10-CM | POA: Diagnosis not present

## 2020-11-06 DIAGNOSIS — E559 Vitamin D deficiency, unspecified: Secondary | ICD-10-CM

## 2020-11-06 DIAGNOSIS — R351 Nocturia: Secondary | ICD-10-CM | POA: Diagnosis not present

## 2020-11-06 DIAGNOSIS — Z0000 Encounter for general adult medical examination without abnormal findings: Secondary | ICD-10-CM | POA: Diagnosis not present

## 2020-11-06 DIAGNOSIS — E785 Hyperlipidemia, unspecified: Secondary | ICD-10-CM | POA: Diagnosis not present

## 2020-11-06 DIAGNOSIS — Z125 Encounter for screening for malignant neoplasm of prostate: Secondary | ICD-10-CM

## 2020-11-06 LAB — LIPID PANEL
Cholesterol: 116 mg/dL (ref 0–200)
HDL: 48 mg/dL (ref >39.00)
LDL Cholesterol: 53 mg/dL (ref 0–99)
NonHDL: 67.85
Total CHOL/HDL Ratio: 2
Triglycerides: 75 mg/dL (ref 0.0–149.0)
VLDL: 15 mg/dL (ref 0.0–40.0)

## 2020-11-06 LAB — CBC WITH DIFFERENTIAL/PLATELET
Basophils Absolute: 0 10*3/uL (ref 0.0–0.1)
Basophils Relative: 0.7 % (ref 0.0–3.0)
Eosinophils Absolute: 0.2 10*3/uL (ref 0.0–0.7)
Eosinophils Relative: 3.6 % (ref 0.0–5.0)
HCT: 41.1 % (ref 39.0–52.0)
Hemoglobin: 14 g/dL (ref 13.0–17.0)
Lymphocytes Relative: 38.3 % (ref 12.0–46.0)
Lymphs Abs: 2 10*3/uL (ref 0.7–4.0)
MCHC: 34 g/dL (ref 30.0–36.0)
MCV: 88.3 fl (ref 78.0–100.0)
Monocytes Absolute: 0.5 10*3/uL (ref 0.1–1.0)
Monocytes Relative: 8.8 % (ref 3.0–12.0)
Neutro Abs: 2.6 10*3/uL (ref 1.4–7.7)
Neutrophils Relative %: 48.6 % (ref 43.0–77.0)
Platelets: 181 10*3/uL (ref 150.0–400.0)
RBC: 4.66 Mil/uL (ref 4.22–5.81)
RDW: 12.9 % (ref 11.5–15.5)
WBC: 5.3 10*3/uL (ref 4.0–10.5)

## 2020-11-06 LAB — PSA: PSA: 0.43 ng/mL (ref 0.10–4.00)

## 2020-11-06 LAB — COMPREHENSIVE METABOLIC PANEL
ALT: 19 U/L (ref 0–53)
AST: 23 U/L (ref 0–37)
Albumin: 4.7 g/dL (ref 3.5–5.2)
Alkaline Phosphatase: 56 U/L (ref 39–117)
BUN: 18 mg/dL (ref 6–23)
CO2: 28 mEq/L (ref 19–32)
Calcium: 9.6 mg/dL (ref 8.4–10.5)
Chloride: 103 mEq/L (ref 96–112)
Creatinine, Ser: 1.02 mg/dL (ref 0.40–1.50)
GFR: 75.85 mL/min (ref >60.00)
Glucose, Bld: 97 mg/dL (ref 70–99)
Potassium: 4.9 mEq/L (ref 3.5–5.1)
Sodium: 138 mEq/L (ref 135–145)
Total Bilirubin: 0.9 mg/dL (ref 0.2–1.2)
Total Protein: 7 g/dL (ref 6.0–8.3)

## 2020-11-06 LAB — VITAMIN D 25 HYDROXY (VIT D DEFICIENCY, FRACTURES): VITD: 38.42 ng/mL (ref 30.00–100.00)

## 2020-11-06 NOTE — Patient Instructions (Addendum)
Health Maintenance Due  Topic Date Due  . Zoster Vaccines- Shingrix (1 of 2)- next year Never done  . PNA vac Low Risk Adult (1 of 2 - PCV13)- prevnar 20 today Never done  . TETANUS/TDAP - consider 1-2 months at pharmacy 07/10/2018   Please stop by lab before you go If you have mychart- we will send your results within 3 business days of [place] receiving them.  If you do not have mychart- we will call you about results within 5 business days of [place] receiving them.  *please also note that you will see labs on mychart as soon as they post. I will later go in and write notes on them- will say "notes from Dr. Yong Channel"  Team please irrigate the left ear  Recommended follow up: Return in about 1 year (around 11/06/2021) for physical or sooner if needed.

## 2020-11-06 NOTE — Addendum Note (Signed)
Addended by: Cranston Neighbor on: 11/06/2020 09:45 AM   Modules accepted: Orders

## 2020-11-06 NOTE — Progress Notes (Signed)
Phone: [phone]   Subjective:  Patient presents today for their annual physical. Chief complaint-noted.   See problem oriented charting- ROS- full  review of systems was completed and negative  except for: seasonal allergies.  The following were reviewed and entered/updated in epic: Past Medical History:  Diagnosis Date  . Hyperlipidemia   . Pre-syncope    Patient Active Problem List   Diagnosis Date Noted  . Hyperlipidemia 12/20/2012    Priority: Medium  . History of adenomatous polyp of colon 09/18/2014    Priority: Low  . Dizziness 12/07/2018  . Vitamin D deficiency 12/02/2017  . Constipation 09/25/2015   Past Surgical History:  Procedure Laterality Date  . COLONOSCOPY  04/18/2015   Stark-polyp  . none    . POLYPECTOMY      Family History  Problem Relation Age of Onset  . Uterine cancer Mother        27  . Coronary artery disease Father        age 11  . Diabetes Mellitus I Daughter   . Hypertension Brother   . Colon cancer Neg Hx   . Colon polyps Neg Hx   . Esophageal cancer Neg Hx   . Rectal cancer Neg Hx   . Stomach cancer Neg Hx     Medications- reviewed and updated Current Outpatient Medications  Medication Sig Dispense Refill  . Alirocumab (PRALUENT) 150 MG/ML SOAJ Inject 1 Dose into the skin every 14 (fourteen) days. 2 mL 11  . aspirin EC 81 MG tablet Take 81 mg by mouth daily. Swallow whole.    . cholecalciferol (VITAMIN D3) 25 MCG (1000 UT) tablet Take 2,000 Units by mouth daily.     . Magnesium Gluconate (MAGONATE PO) Take 800 mg by mouth.    . rosuvastatin (CRESTOR) 5 MG tablet Take 1 tablet (5 mg total) by mouth every other day. 45 tablet 3   No current facility-administered medications for this visit.    Allergies-reviewed and updated No Known Allergies  Social History   Social History Narrative   Married. 1 daughter. No grandkids.    GED      Works in [employer] in [employer]. 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        Hobbies: time at home, yardwork   Objective  Objective:  BP 110/72   Pulse (!) 58   Temp 98.2 F (36.8 C)   Ht 5\' 10"  (1.778 m)   Wt 179 lb 4 oz (81.3 kg)   SpO2 94%   BMI 25.72 kg/m  Gen: NAD, resting comfortably HEENT: Mucous membranes are moist. Oropharynx normal, TM normal- mild cerumen in the right canal; cerumen impaction in left ear canal-to be irrigated by team Neck: no thyromegaly. No carotid bruits CV: RRR no murmurs rubs or gallops Lungs: CTAB no crackles, wheeze, rhonchi Abdomen: soft/nontender/nondistended/normal bowel sounds. No rebound or guarding.  Ext: no edema, 2+ DP/PT pulses. Skin: warm, dry Neuro: grossly normal, moves all extremities, PERRLA    Assessment and Plan  70 y.o. male presenting for annual physical.  Health Maintenance counseling: 1. Anticipatory guidance: Patient counseled regarding regular dental exams -q6 months, eye exams -yearly,  avoiding smoking and second hand smoke, limiting alcohol to 2 beverages per day- 0 per day.   2. Risk factor reduction:  Advised patient of need for regular exercise and diet rich and fruits and vegetables to reduce risk of heart attack and stroke. Exercise- 2.5 miles five days per week with wife- he is not performing complex  exercise or lifting, but is still walking with wife-discussed adding some strength training. Diet- reasonably healthy-patient has continued to lose weight- he wants to maintian-barely overweight per BMI-I think his weight overall is healthy/reasonable Wt Readings from Last 3 Encounters:  11/06/20 179 lb 4 oz (81.3 kg)  10/15/20 183 lb 9.6 oz (83.3 kg)  10/11/20 184 lb (83.5 kg)  3. Immunizations/screenings/ancillary studies-discussed Prevnar 20 today, discussed Shingrix- considering next year, discussed COVID-19 boosters- considering in Fall 2022, discussed Tdap- recommended for 1-2 months. Immunization History  Administered Date(s) Administered  . Influenza Split 03/03/2013  . Influenza, High  Dose Seasonal PF 03/19/2018  . Influenza,inj,Quad PF,6+ Mos 02/28/2014, 03/08/2015, 02/26/2016, 03/17/2017  . Influenza-Unspecified 03/19/2018, 03/01/2019  . Moderna SARS-COV2 Booster Vaccination 05/09/2020  . Moderna Sars-Covid-2 Vaccination 07/13/2019, 08/11/2019  . Zoster, Live 05/16/2013   4. Prostate cancer screening- Low risk prior PSA trend. Would consider baseline 1-- updated with labs on 11/03/19.  Continue to trend PSA yearly- nocturia once a night-stable Lab Results  Component Value Date   PSA 0.39 11/03/2019   PSA 0.57 08/23/2018   PSA 1.05 02/02/2018   5. Colon cancer screening - history of adenomatous colo polyp November 2016- was due on November 2021 he had another adenomatous polyp at that time on 10/11/2020- due for 3-year repeat at this point 6. Skin cancer screening- Dr. [facility]. advised regular sunscreen use. Denies worrisome, changing, or new skin lesions.  7. Never smoker. 8. STD screening - monogamous and declines.  Status of chronic or acute concerns   #CAD-coronary artery calcium score of 752 which is 87th percentile for age November 17, 2019 follows with Dr. Hilty/lipid clincic #hyperlipidemia S: Medication: Praluent 150 mg every 14 days, aspirin 81 mg, Crestor 5 mg every other day started in late April  A/P: CAD thankfully picked up before symptomatic- EXTR coronary calcium scoring.  Remains asymptomatic.  Continue current medication regimen-hoping LDL will be under 70 with addition of Crestor through lipid clinic-previously did not tolerate at higher doses  For hyperlipidemia goal LDL under 70-likely continue current medication  #Vitamin D deficiency S: Medication: 2000 units a day A/P:  hopefully stable- update vitamin D  today. Continue current meds   #History of vertigo- recurrent mild issues intermittently-vestibular rehab has helped in the past-rare episodes but not concerning to the patient   Recommended follow up: Return in about 1 year (around  11/06/2021) for physical or sooner if needed. Future Appointments  Date Time Provider [place]  11/08/2020 10:15 AM [facility]  02/20/2021  9:30 AM Hilty, Nadean Corwin, MD [facility]    Lab/Order associations: I believe fasting   ICD-10-CM   1. Preventative health care  Z00.00   2. Hyperlipidemia, unspecified hyperlipidemia type  E78.5 CBC with Differential/Platelet    Comprehensive metabolic panel    Lipid panel  3. Screening for prostate cancer  Z12.5   4. Nocturia  R35.1 PSA  5. Vitamin D deficiency  E55.9 VITAMIN D 25 Hydroxy (Vit-D Deficiency, Fractures)    I,Harris Phan,acting as a scribe for Garret Reddish, MD.,have documented all relevant documentation on the behalf of Garret Reddish, MD,as directed by  Garret Reddish, MD while in the presence of Garret Reddish, MD.    I, Garret Reddish, MD, have reviewed all documentation for this visit. The documentation on 11/06/20 for the exam, diagnosis, procedures, and orders are all accurate and complete.   Return precautions advised.  Garret Reddish, MD

## 2020-11-07 ENCOUNTER — Encounter (HOSPITAL_BASED_OUTPATIENT_CLINIC_OR_DEPARTMENT_OTHER): Payer: Self-pay

## 2020-11-08 ENCOUNTER — Ambulatory Visit (INDEPENDENT_AMBULATORY_CARE_PROVIDER_SITE_OTHER): Payer: Medicare HMO

## 2020-11-08 DIAGNOSIS — Z0000 Encounter for general adult medical examination without abnormal findings: Secondary | ICD-10-CM | POA: Diagnosis not present

## 2020-11-08 NOTE — Progress Notes (Signed)
Virtual Visit via Telephone Note  I connected with  Mike Hall on 11/08/20 at 10:15 AM EDT by telephone and verified that I am speaking with the correct person using two identifiers.  Medicare Annual Wellness visit completed telephonically due to Covid-19 pandemic.   Persons participating in this call: This Health Coach and this patient.   Location: Patient: Home Provider: Office   I discussed the limitations, risks, security and privacy concerns of performing an evaluation and management service by telephone and the availability of in person appointments. The patient expressed understanding and agreed to proceed.  Unable to perform video visit due to video visit attempted and failed and/or patient does not have video capability.   Some vital signs may be absent or patient reported.   Willette Brace, LPN    Subjective:   Mike Hall is a 70 y.o. male who presents for Medicare Annual/Subsequent preventive examination.  Review of Systems     Cardiac Risk Factors include: advanced age (>70men, >29 women);dyslipidemia;male gender     Objective:    There were no vitals filed for this visit. There is no height or weight on file to calculate BMI.  Advanced Directives 11/08/2020 12/07/2018 12/06/2018 04/04/2015  Does Patient Have a Medical Advance Directive? No - No Yes  Type of Advance Directive - - Public librarian;Living will  Does patient want to make changes to medical advance directive? - - - No - Patient declined  Copy of [place] in Chart? - - - No - copy requested  Would patient like information on creating a medical advance directive? No - Patient declined No - Patient declined - -    Current Medications (verified) Outpatient Encounter Medications as of 11/08/2020  Medication Sig  . Alirocumab (PRALUENT) 150 MG/ML SOAJ Inject 1 Dose into the skin every 14 (fourteen) days.  Marland Kitchen aspirin EC 81 MG tablet Take 81 mg by mouth daily. Swallow  whole.  . cholecalciferol (VITAMIN D3) 25 MCG (1000 UT) tablet Take 2,000 Units by mouth daily.   . Magnesium Gluconate (MAGONATE PO) Take 800 mg by mouth.  . rosuvastatin (CRESTOR) 5 MG tablet Take 1 tablet (5 mg total) by mouth every other day.   No facility-administered encounter medications on file as of 11/08/2020.    Allergies (verified) Patient has no known allergies.   History: Past Medical History:  Diagnosis Date  . Hyperlipidemia   . Pre-syncope    Past Surgical History:  Procedure Laterality Date  . COLONOSCOPY  04/18/2015   Stark-polyp  . none    . POLYPECTOMY     Family History  Problem Relation Age of Onset  . Uterine cancer Mother        51  . Coronary artery disease Father        age 41  . Diabetes Mellitus I Daughter   . Hypertension Brother   . Colon cancer Neg Hx   . Colon polyps Neg Hx   . Esophageal cancer Neg Hx   . Rectal cancer Neg Hx   . Stomach cancer Neg Hx    Social History   Socioeconomic History  . Marital status: Married    Spouse name: Not on file  . Number of children: Not on file  . Years of education: Not on file  . Highest education level: Not on file  Occupational History  . Not on file  Tobacco Use  . Smoking status: Never Smoker  . Smokeless tobacco:  Never Used  Vaping Use  . Vaping Use: Never used  Substance and Sexual Activity  . Alcohol use: No  . Drug use: No  . Sexual activity: Not on file  Other Topics Concern  . Not on file  Social History Narrative   Married. 1 daughter. No grandkids.    GED      Works in [employer] in [place]. 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       Hobbies: time at home, yardwork   Social Determinants of Health   Financial Resource Strain: Low Risk   . Difficulty of Paying Living Expenses: Not hard at all  Food Insecurity: No Food Insecurity  . Worried About Charity fundraiser in the Last Year: Never true  . Ran Out of Food in the Last Year: Never true  Transportation  Needs: No Transportation Needs  . Lack of Transportation (Medical): No  . Lack of Transportation (Non-Medical): No  Physical Activity: Sufficiently Active  . Days of Exercise per Week: 5 days  . Minutes of Exercise per Session: 40 min  Stress: Not on file  Social Connections: Moderately Integrated  . Frequency of Communication with Friends and Family: More than three times a week  . Frequency of Social Gatherings with Friends and Family: More than three times a week  . Attends Religious Services: More than 4 times per year  . Active Member of Clubs or Organizations: No  . Attends Archivist Meetings: Never  . Marital Status: Married    Tobacco Counseling Counseling given: Not Answered   Clinical Intake:  Pre-visit preparation completed: Yes  Pain : No/denies pain     BMI - recorded: 25.72 Nutritional Status: BMI 25 -29 Overweight Nutritional Risks: None Diabetes: No  How often do you need to have someone help you when you read instructions, pamphlets, or other written materials from your doctor or pharmacy?: 1 - Never  Diabetic?No  Interpreter Needed?: No  Information entered by :: Charlott Rakes, LPN   Activities of Daily Living In your present state of health, do you have any difficulty performing the following activities: 11/08/2020  Hearing? N  Vision? N  Difficulty concentrating or making decisions? N  Walking or climbing stairs? N  Dressing or bathing? N  Doing errands, shopping? N  Preparing Food and eating ? N  Using the Toilet? N  In the past six months, have you accidently leaked urine? N  Do you have problems with loss of bowel control? N  Managing your Medications? N  Managing your Finances? N  Housekeeping or managing your Housekeeping? N  Some recent data might be hidden    Patient Care Team: Marin Olp, MD as PCP - General (Family Medicine) Jari Pigg, MD as Consulting Physician (Dermatology) Ladene Artist, MD as  Consulting Physician (Gastroenterology)  Indicate any recent Medical Services you may have received from other than [facility] providers in the past year (date may be approximate).     Assessment:   This is a routine wellness examination for Mike Hall.  Hearing/Vision screen  Hearing Screening   125Hz  250Hz  500Hz  1000Hz  2000Hz  3000Hz  4000Hz  6000Hz  8000Hz   Right ear:           Left ear:           Comments: Pt denies any hearing issues   Vision Screening Comments: Pt follows up with [facility] for annual eye exams   Dietary issues and exercise activities discussed: Current Exercise Habits: Home exercise routine,  Type of exercise: walking, Time (Minutes): 40, Frequency (Times/Week): 5, Weekly Exercise (Minutes/Week): 200  Goals Addressed            This Visit's Progress   . Patient Stated       Stay healthy and live long       Depression Screen PHQ 2/9 Scores 11/08/2020 11/06/2020 11/03/2019 08/23/2018 08/23/2018 05/04/2017 09/04/2016  PHQ - 2 Score 0 0 0 0 0 0 0  PHQ- 9 Score - 0 - - - - -    Fall Risk Fall Risk  11/08/2020 11/06/2020 11/03/2019 08/23/2018 05/04/2017  Falls in the past year? 0 0 0 0 No  Number falls in past yr: 0 0 0 0 -  Injury with Fall? 0 - 0 0 -  Risk for fall due to : Impaired vision - No Fall Risks - -  Follow up Falls prevention discussed - Falls evaluation completed - -    FALL RISK PREVENTION PERTAINING TO THE HOME:  Any stairs in or around the home? Yes  If so, are there any without handrails? No  Home free of loose throw rugs in walkways, pet beds, electrical cords, etc? Yes  Adequate lighting in your home to reduce risk of falls? Yes   ASSISTIVE DEVICES UTILIZED TO PREVENT FALLS:  Life alert? No  Use of a cane, walker or w/c? No  Grab bars in the bathroom? No  Shower chair or bench in shower? Yes  Elevated toilet seat or a handicapped toilet? Yes   TIMED UP AND GO:  Was the test performed? No     Cognitive Function:     6CIT Screen 11/08/2020   What Year? 0 points  What month? 0 points  What time? 0 points  Count back from 20 0 points  Months in reverse 0 points  Repeat phrase 0 points  Total Score 0    Immunizations Immunization History  Administered Date(s) Administered  . Influenza Split 03/03/2013  . Influenza, High Dose Seasonal PF 03/19/2018  . Influenza,inj,Quad PF,6+ Mos 02/28/2014, 03/08/2015, 02/26/2016, 03/17/2017  . Influenza-Unspecified 03/19/2018, 03/01/2019  . Moderna SARS-COV2 Booster Vaccination 05/09/2020  . Moderna Sars-Covid-2 Vaccination 07/13/2019, 08/11/2019  . PNEUMOCOCCAL CONJUGATE-20 11/06/2020  . Zoster, Live 05/16/2013    TDAP status: Due, Education has been provided regarding the importance of this vaccine. Advised may receive this vaccine at local pharmacy or Health Dept. Aware to provide a copy of the vaccination record if obtained from local pharmacy or Health Dept. Verbalized acceptance and understanding.  Flu Vaccine status: Due, Education has been provided regarding the importance of this vaccine. Advised may receive this vaccine at local pharmacy or Health Dept. Aware to provide a copy of the vaccination record if obtained from local pharmacy or Health Dept. Verbalized acceptance and understanding.  Pneumococcal vaccine status: Up to date  Covid-19 vaccine status: Completed vaccines  Qualifies for Shingles Vaccine? Yes   Zostavax completed Yes   Shingrix Completed?: No.    Education has been provided regarding the importance of this vaccine. Patient has been advised to call insurance company to determine out of pocket expense if they have not yet received this vaccine. Advised may also receive vaccine at local pharmacy or Health Dept. Verbalized acceptance and understanding.  Screening Tests Health Maintenance  Topic Date Due  . Zoster Vaccines- Shingrix (1 of 2) Never done  . PNA vac Low Risk Adult (1 of 2 - PCV13) 12/20/2017  . TETANUS/TDAP  07/10/2018  . INFLUENZA VACCINE  01/07/2021  . COLONOSCOPY (Pts 45-24yrs Insurance coverage will need to be confirmed)  10/12/2023  . COVID-19 Vaccine  Completed  . Hepatitis C Screening  Completed  . HPV VACCINES  Aged Out    Health Maintenance  Health Maintenance Due  Topic Date Due  . Zoster Vaccines- Shingrix (1 of 2) Never done  . PNA vac Low Risk Adult (1 of 2 - PCV13) 12/20/2017  . TETANUS/TDAP  07/10/2018    Colorectal cancer screening: Type of screening: Colonoscopy. Completed 10/11/20. Repeat every 3 years   Additional Screening:  Hepatitis C Screening:  Completed 09/19/16  Vision Screening: Recommended annual ophthalmology exams for early detection of glaucoma and other disorders of the eye. Is the patient up to date with their annual eye exam?  Yes  Who is the provider or what is the name of the office in which the patient attends annual eye exams? [facility]  If pt is not established with a provider, would they like to be referred to a provider to establish care? No .   Dental Screening: Recommended annual dental exams for proper oral hygiene  Community Resource Referral / Chronic Care Management: CRR required this visit?  No   CCM required this visit?  No      Plan:     I have personally reviewed and noted the following in the patient's chart:   . Medical and social history . Use of alcohol, tobacco or illicit drugs  . Current medications and supplements including opioid prescriptions. Patient is not currently taking opioid prescriptions. . Functional ability and status . Nutritional status . Physical activity . Advanced directives . List of other physicians . Hospitalizations, surgeries, and ER visits in previous 12 months . Vitals . Screenings to include cognitive, depression, and falls . Referrals and appointments  In addition, I have reviewed and discussed with patient certain preventive protocols, quality metrics, and best practice recommendations. A written personalized care  plan for preventive services as well as general preventive health recommendations were provided to patient.     Willette Brace, LPN   12/11/9447   Nurse Notes: None

## 2020-11-08 NOTE — Patient Instructions (Signed)
Mike Hall , Thank you for taking time to come for your Medicare Wellness Visit. I appreciate your ongoing commitment to your health goals. Please review the following plan we discussed and let me know if I can assist you in the future.   Screening recommendations/referrals: Colonoscopy: Done 10/11/20 Recommended yearly ophthalmology/optometry visit for glaucoma screening and checkup Recommended yearly dental visit for hygiene and checkup  Vaccinations: Influenza vaccine: Due in season Pneumococcal vaccine: Up to date Tdap vaccine: Due  Shingles vaccine: Shingrix discussed. Please contact your pharmacy for coverage information.    Covid-19: Completed 2/3, 3/4, & 05/09/20  Advanced directives: Please bring a copy of your health care power of attorney and living will to the office at your convenience.  Conditions/risks identified: Stay Healthy and live long   Next appointment: Follow up in one year for your annual wellness visit.   Preventive Care 63 Years and Older, Male Preventive care refers to lifestyle choices and visits with your health care provider that can promote health and wellness. What does preventive care include?  A yearly physical exam. This is also called an annual well check.  Dental exams once or twice a year.  Routine eye exams. Ask your health care provider how often you should have your eyes checked.  Personal lifestyle choices, including:  Daily care of your teeth and gums.  Regular physical activity.  Eating a healthy diet.  Avoiding tobacco and drug use.  Limiting alcohol use.  Practicing safe sex.  Taking low doses of aspirin every day.  Taking vitamin and mineral supplements as recommended by your health care provider. What happens during an annual well check? The services and screenings done by your health care provider during your annual well check will depend on your age, overall health, lifestyle risk factors, and family history of  disease. Counseling  Your health care provider may ask you questions about your:  Alcohol use.  Tobacco use.  Drug use.  Emotional well-being.  Home and relationship well-being.  Sexual activity.  Eating habits.  History of falls.  Memory and ability to understand (cognition).  Work and work Statistician. Screening  You may have the following tests or measurements:  Height, weight, and BMI.  Blood pressure.  Lipid and cholesterol levels. These may be checked every 5 years, or more frequently if you are over 73 years old.  Skin check.  Lung cancer screening. You may have this screening every year starting at age 42 if you have a 30-pack-year history of smoking and currently smoke or have quit within the past 15 years.  Fecal occult blood test (FOBT) of the stool. You may have this test every year starting at age 22.  Flexible sigmoidoscopy or colonoscopy. You may have a sigmoidoscopy every 5 years or a colonoscopy every 10 years starting at age 37.  Prostate cancer screening. Recommendations will vary depending on your family history and other risks.  Hepatitis C blood test.  Hepatitis B blood test.  Sexually transmitted disease (STD) testing.  Diabetes screening. This is done by checking your blood sugar (glucose) after you have not eaten for a while (fasting). You may have this done every 1-3 years.  Abdominal aortic aneurysm (AAA) screening. You may need this if you are a current or former smoker.  Osteoporosis. You may be screened starting at age 46 if you are at high risk. Talk with your health care provider about your test results, treatment options, and if necessary, the need for more tests. Vaccines  Your health care provider may recommend certain vaccines, such as:  Influenza vaccine. This is recommended every year.  Tetanus, diphtheria, and acellular pertussis (Tdap, Td) vaccine. You may need a Td booster every 10 years.  Zoster vaccine. You may  need this after age 54.  Pneumococcal 13-valent conjugate (PCV13) vaccine. One dose is recommended after age 45.  Pneumococcal polysaccharide (PPSV23) vaccine. One dose is recommended after age 101. Talk to your health care provider about which screenings and vaccines you need and how often you need them. This information is not intended to replace advice given to you by your health care provider. Make sure you discuss any questions you have with your health care provider. Document Released: 06/22/2015 Document Revised: 02/13/2016 Document Reviewed: 03/27/2015 Elsevier Interactive Patient Education  2017 [place] Prevention in the Home Falls can cause injuries. They can happen to people of all ages. There are many things you can do to make your home safe and to help prevent falls. What can I do on the outside of my home?  Regularly fix the edges of walkways and driveways and fix any cracks.  Remove anything that might make you trip as you walk through a door, such as a raised step or threshold.  Trim any bushes or trees on the path to your home.  Use bright outdoor lighting.  Clear any walking paths of anything that might make someone trip, such as rocks or tools.  Regularly check to see if handrails are loose or broken. Make sure that both sides of any steps have handrails.  Any raised decks and porches should have guardrails on the edges.  Have any leaves, snow, or ice cleared regularly.  Use sand or salt on walking paths during winter.  Clean up any spills in your garage right away. This includes oil or grease spills. What can I do in the bathroom?  Use night lights.  Install grab bars by the toilet and in the tub and shower. Do not use towel bars as grab bars.  Use non-skid mats or decals in the tub or shower.  If you need to sit down in the shower, use a plastic, non-slip stool.  Keep the floor dry. Clean up any water that spills on the floor as soon as it  happens.  Remove soap buildup in the tub or shower regularly.  Attach bath mats securely with double-sided non-slip rug tape.  Do not have throw rugs and other things on the floor that can make you trip. What can I do in the bedroom?  Use night lights.  Make sure that you have a light by your bed that is easy to reach.  Do not use any sheets or blankets that are too big for your bed. They should not hang down onto the floor.  Have a firm chair that has side arms. You can use this for support while you get dressed.  Do not have throw rugs and other things on the floor that can make you trip. What can I do in the kitchen?  Clean up any spills right away.  Avoid walking on wet floors.  Keep items that you use a lot in easy-to-reach places.  If you need to reach something above you, use a strong step stool that has a grab bar.  Keep electrical cords out of the way.  Do not use floor polish or wax that makes floors slippery. If you must use wax, use non-skid floor wax.  Do  not have throw rugs and other things on the floor that can make you trip. What can I do with my stairs?  Do not leave any items on the stairs.  Make sure that there are handrails on both sides of the stairs and use them. Fix handrails that are broken or loose. Make sure that handrails are as long as the stairways.  Check any carpeting to make sure that it is firmly attached to the stairs. Fix any carpet that is loose or worn.  Avoid having throw rugs at the top or bottom of the stairs. If you do have throw rugs, attach them to the floor with carpet tape.  Make sure that you have a light switch at the top of the stairs and the bottom of the stairs. If you do not have them, ask someone to add them for you. What else can I do to help prevent falls?  Wear shoes that:  Do not have high heels.  Have rubber bottoms.  Are comfortable and fit you well.  Are closed at the toe. Do not wear sandals.  If you  use a stepladder:  Make sure that it is fully opened. Do not climb a closed stepladder.  Make sure that both sides of the stepladder are locked into place.  Ask someone to hold it for you, if possible.  Clearly mark and make sure that you can see:  Any grab bars or handrails.  First and last steps.  Where the edge of each step is.  Use tools that help you move around (mobility aids) if they are needed. These include:  Canes.  Walkers.  Scooters.  Crutches.  Turn on the lights when you go into a dark area. Replace any light bulbs as soon as they burn out.  Set up your furniture so you have a clear path. Avoid moving your furniture around.  If any of your floors are uneven, fix them.  If there are any pets around you, be aware of where they are.  Review your medicines with your doctor. Some medicines can make you feel dizzy. This can increase your chance of falling. Ask your doctor what other things that you can do to help prevent falls. This information is not intended to replace advice given to you by your health care provider. Make sure you discuss any questions you have with your health care provider. Document Released: 03/22/2009 Document Revised: 11/01/2015 Document Reviewed: 06/30/2014 Elsevier Interactive Patient Education  2017 [employer].

## 2021-01-17 ENCOUNTER — Other Ambulatory Visit: Payer: Self-pay | Admitting: Internal Medicine

## 2021-01-17 ENCOUNTER — Telehealth: Payer: Self-pay | Admitting: Internal Medicine

## 2021-01-17 NOTE — Telephone Encounter (Signed)
*  STAT* If patient is at the pharmacy, call can be transferred to refill team.   1. Which medications need to be refilled? (please list name of each medication and dose if known)  Alirocumab (PRALUENT) 150 MG/ML SOAJ  2. Which pharmacy/location (including street and city if local pharmacy) is medication to be sent to? [id number] [place], [place] - [address]. Do they need a 30 day or 90 day supply?   90 day supply  Patient's wife state the patient will be completely out of medication by 01/19/21.

## 2021-01-23 DIAGNOSIS — Z20822 Contact with and (suspected) exposure to covid-19: Secondary | ICD-10-CM | POA: Diagnosis not present

## 2021-01-24 ENCOUNTER — Telehealth (INDEPENDENT_AMBULATORY_CARE_PROVIDER_SITE_OTHER): Payer: Medicare HMO | Admitting: Family Medicine

## 2021-01-24 ENCOUNTER — Encounter: Payer: Self-pay | Admitting: Family Medicine

## 2021-01-24 ENCOUNTER — Encounter (HOSPITAL_BASED_OUTPATIENT_CLINIC_OR_DEPARTMENT_OTHER): Payer: Self-pay

## 2021-01-24 VITALS — Temp 98.0°F

## 2021-01-24 DIAGNOSIS — U071 COVID-19: Secondary | ICD-10-CM

## 2021-01-24 MED ORDER — NIRMATRELVIR/RITONAVIR (PAXLOVID)TABLET: 3.0000 | ORAL_TABLET | Freq: Two times a day (BID) | ORAL | 0 refills | Status: AC

## 2021-01-24 MED ORDER — BENZONATATE 100 MG PO CAPS: 100.0000 mg | ORAL_CAPSULE | Freq: Three times a day (TID) | ORAL | 0 refills | Status: DC | PRN

## 2021-01-24 NOTE — Progress Notes (Signed)
Virtual Visit via Video Note  I connected with Morgen  on 01/24/21 at  3:00 PM EDT by a video enabled telemedicine application and verified that I am speaking with the correct person using two identifiers.  Location patient: home, [place] Location provider:work or home office Persons participating in the virtual visit: patient, provider, pt's wife  I discussed the limitations of evaluation and management by telemedicine and the availability of in person appointments. The patient expressed understanding and agreed to proceed.   HPI:  Acute telemedicine visit for Covid19: -Onset: started about 2 days ago w/ positive home test today -Symptoms include:sore throat, nasal congestion, cough, headache -Denies: fever, CP, SOB, NVD, inability to eat/drink/get out of bed -Has tried:tylenol -Pertinent past medical history: overweight, over 50yo and see below, CAD -active -Pertinent medication allergies: No Known Allergies -COVID-19 vaccine status: has had 2 doses and 1 booster -GFR 75 on labs in May 2022  ROS: See pertinent positives and negatives per HPI.  Past Medical History:  Diagnosis Date   Hyperlipidemia    Pre-syncope     Past Surgical History:  Procedure Laterality Date   COLONOSCOPY  04/18/2015   [facility]-polyp   none     POLYPECTOMY       Current Outpatient Medications:    aspirin EC 81 MG tablet, Take 81 mg by mouth daily. Swallow whole., Disp: , Rfl:    benzonatate (TESSALON PERLES) 100 MG capsule, Take 1 capsule (100 mg total) by mouth 3 (three) times daily as needed., Disp: 20 capsule, Rfl: 0   cholecalciferol (VITAMIN D3) 25 MCG (1000 UT) tablet, Take 2,000 Units by mouth daily. , Disp: , Rfl:    Magnesium Gluconate (MAGONATE PO), Take 800 mg by mouth., Disp: , Rfl:    nirmatrelvir/ritonavir EUA (PAXLOVID) 20 x 150 MG & 10 x '100MG'$  TABS, Take 3 tablets by mouth 2 (two) times daily for 5 days. (Take nirmatrelvir 150 mg two tablets twice daily for 5 days and ritonavir 100 mg one  tablet twice daily for 5 days) Patient GFR is 74, Disp: 30 tablet, Rfl: 0   PRALUENT 150 MG/ML SOAJ, INJECT 1 DOSE INTO THE SKIN EVERY 14 (FOURTEEN) DAYS., Disp: 6 mL, Rfl: 3   rosuvastatin (CRESTOR) 5 MG tablet, Take 1 tablet (5 mg total) by mouth every other day., Disp: 45 tablet, Rfl: 3  EXAM:  VITALS per patient if applicable:  GENERAL: alert, oriented, appears well and in no acute distress  HEENT: atraumatic, conjunttiva clear, no obvious abnormalities on inspection of external nose and ears  NECK: normal movements of the head and neck  LUNGS: on inspection no signs of respiratory distress, breathing rate appears normal, no obvious gross SOB, gasping or wheezing  CV: no obvious cyanosis  MS: moves all visible extremities without noticeable abnormality  PSYCH/NEURO: pleasant and cooperative, no obvious depression or anxiety, speech and thought processing grossly intact  ASSESSMENT AND PLAN:  Discussed the following assessment and plan:  COVID-19   Discussed treatment options (infusions and oral options and risk of drug interactions), ideal treatment window, potential complications, isolation and precautions for COVID-19.  Discussed possibility of rebound with antivirals and the need to reisolate if it should occur for 5 days. Checked for/reviewed any labs done in the last 90 days with GFR listed in HPI if available. After lengthy discussion, the patient opted for treatment with Paxil COVID due to being higher risk for complications of covid or severe disease and other factors. Discussed EUA status of this drug  and the fact that there is preliminary limited knowledge of risks/interactions/side effects per EUA document vs possible benefits and precautions.  Advised to hold his Crestor during treatment and restart 3 days after he finishes treatment.  I did not see any interactions with his other drugs after checking 3 different drug checkers.  Did advise that he still discuss his  medicines with the pharmacist to see if they have any recommendations as well.  This information was shared with patient during the visit and also was provided in patient instructions. Also, advised that patient discuss risks/interactions and use with pharmacist/treatment team as well. The patient did want a prescription for cough, Tessalon Rx sent.  Other symptomatic care measures summarized in patient instructions. Work/School slipped offered:  declined Advised to seek prompt in person care if worsening, new symptoms arise, or if is not improving with treatment. Discussed options for inperson care if PCP office not available. Did let this patient know that I only do telemedicine on Tuesdays and Thursdays for [facility]. Advised to schedule follow up visit with PCP or [facility] if any further questions or concerns to avoid delays in care.   I discussed the assessment and treatment plan with the patient. The patient was provided an opportunity to ask questions and all were answered. The patient agreed with the plan and demonstrated an understanding of the instructions.     Lucretia Kern, DO

## 2021-01-24 NOTE — Patient Instructions (Addendum)
HOME CARE TIPS:  -[facility] testing information: [email or web address]-[email or web address]/ OR [phone] Most pharmacies also offer testing and home test kits. If the Covid19 test is positive, please make a prompt follow up visit with your primary care office or with [facility] to discuss treatment options. Treatments for Covid19 are best given early in the course of the illness.   -I sent the medication(s) we discussed to your pharmacy: Meds ordered this encounter  Medications   nirmatrelvir/ritonavir EUA (PAXLOVID) 20 x 150 MG & 10 x 100MG TABS    Sig: Take 3 tablets by mouth 2 (two) times daily for 5 days. (Take nirmatrelvir 150 mg two tablets twice daily for 5 days and ritonavir 100 mg one tablet twice daily for 5 days) Patient GFR is 74    Dispense:  30 tablet    Refill:  0   benzonatate (TESSALON PERLES) 100 MG capsule    Sig: Take 1 capsule (100 mg total) by mouth 3 (three) times daily as needed.    Dispense:  20 capsule    Refill:  0     -I sent in the [facility] treatment or referral you requested per our discussion. Please see the information provided below and discuss further with the pharmacist/treatment team.  -If taking Paxlovid, please review all medications, supplement and over the counter drugs with your pharmacist and ask them to check for any interactions. Please make the following changes to your regular medications while taking Paxlovid: -hold your crestor (rosuvastatin) during treatment and restart 3 days after finishing treatment. -check with pharmacy to ensure you can take the Paxlovid with the Praluent  -If taking Paxlovid, there is a chance of rebound illness after finishing your treatment. If you become sick again please isolate for an additional 5 days.   -can use tylenol  if needed for fevers, aches and pains per instructions  -can use nasal saline a few times per day if you have nasal congestion  -stay hydrated, drink plenty of  fluids and eat small healthy meals - avoid dairy  -If the Covid test is positive, check out the [facility] website for more information on home care, transmission and treatment for COVID19  -follow up with your doctor in 2-3 days unless improving and feeling better  -stay home while sick, except to seek medical care. If you have COVID19, ideally it would be best to stay home for a full 10 days since the onset of symptoms PLUS one day of no fever and feeling better. Wear a good mask that fits snugly (such as N95 or KN95) if around others to reduce the risk of transmission.  It was nice to meet you today, and I really hope you are feeling better soon. I help [facility] out with telemedicine visits on Tuesdays and Thursdays and am available for visits on those days. If you have any concerns or questions following this visit please schedule a follow up visit with your Primary Care doctor or seek care at a local urgent care clinic to avoid delays in care.    Seek in person care or schedule a follow up video visit promptly if your symptoms worsen, new concerns arise or you are not improving with treatment. Call 911 and/or seek emergency care if your symptoms are severe or life threatening.  FACT SHEET FOR PATIENTS, PARENTS, AND CAREGIVERS EMERGENCY USE AUTHORIZATION (EUA) OF PAXLOVID FOR CORONAVIRUS DISEASE 2019 (COVID-19) You are being given this Fact Sheet because your healthcare provider believes it is necessary to provide you with  PAXLOVID for the treatment of mild-to-moderate coronavirus disease (COVID-19) caused by the SARS-CoV-2 virus. This Fact Sheet contains information to help you understand the risks and benefits of taking the PAXLOVID you have received or may receive. The U.S. Food and Drug Administration (FDA) has issued an Emergency Use Authorization (EUA) to make PAXLOVID available during the COVID-19 pandemic (for more details about an EUA please see "What is an Emergency Use  Authorization?" at the end of this document). PAXLOVID is not an FDA-approved medicine in the [place]. Read this Fact Sheet for information about PAXLOVID. Talk to your healthcare provider about your options or if you have any questions. It is your choice to take PAXLOVID.  What is COVID-19? COVID-19 is caused by a virus called a coronavirus. You can get COVID-19 through close contact with another person who has the virus. COVID-19 illnesses have ranged from very mild-to-severe, including illness resulting in death. While information so far suggests that most COVID-19 illness is mild, serious illness can happen and may cause some of your other medical conditions to become worse. Older people and people of all ages with severe, long lasting (chronic) medical conditions like heart disease, lung disease, and diabetes, for example seem to be at higher risk of being hospitalized for COVID-19.  What is PAXLOVID? PAXLOVID is an investigational medicine used to treat mild-to-moderate COVID-19 in adults and children [25 years of age and older weighing at least 7 pounds (5 kg)] with positive results of direct SARS-CoV-2 viral testing, and who are at high risk for progression to severe COVID-19, including hospitalization or death. PAXLOVID is investigational because it is still being studied. There is limited information about the safety and effectiveness of using PAXLOVID to treat people with mild-to-moderate COVID-19.  The FDA has authorized the emergency use of PAXLOVID for the treatment of mild-tomoderate COVID-19 in adults and children [9 years of age and older weighing at least 1 pounds (51 kg)] with a positive test for the virus that causes COVID-19, and who are at high risk for progression to severe COVID-19, including hospitalization or death, under an EUA. 1 Revised: 24 August 2020   What should I tell my healthcare provider before I take PAXLOVID? Tell your healthcare  provider if you: ? Have any allergies ? Have liver or kidney disease ? Are pregnant or plan to become pregnant ? Are breastfeeding a child ? Have any serious illnesses  Tell your healthcare provider about all the medicines you take, including prescription and over-the-counter medicines, vitamins, and herbal supplements. Some medicines may interact with PAXLOVID and may cause serious side effects. Keep a list of your medicines to show your healthcare provider and pharmacist when you get a new medicine.  You can ask your healthcare provider or pharmacist for a list of medicines that interact with PAXLOVID. Do not start taking a new medicine without telling your healthcare provider. Your healthcare provider can tell you if it is safe to take PAXLOVID with other medicines.  Tell your healthcare provider if you are taking combined hormonal contraceptive. PAXLOVID may affect how your birth control pills work. Females who are able to become pregnant should use another effective alternative form of contraception or an additional barrier method of contraception. Talk to your healthcare provider if you have any questions about contraceptive methods that might be right for you.  How do I take PAXLOVID? ? PAXLOVID consists of 2 medicines: nirmatrelvir and ritonavir. o Take 2 pink tablets of nirmatrelvir with 1 white tablet of  ritonavir by mouth 2 times each day (in the morning and in the evening) for 5 days. For each dose, take all 3 tablets at the same time. o If you have kidney disease, talk to your healthcare provider. You may need a different dose. ? Swallow the tablets whole. Do not chew, break, or crush the tablets. ? Take PAXLOVID with or without food. ? Do not stop taking PAXLOVID without talking to your healthcare provider, even if you feel better. ? If you miss a dose of PAXLOVID within 8 hours of the time it is usually taken, take it as soon as you remember. If you miss a dose by  more than 8 hours, skip the missed dose and take the next dose at your regular time. Do not take 2 doses of PAXLOVID at the same time. ? If you take too much PAXLOVID, call your healthcare provider or go to the nearest hospital emergency room right away. ? If you are taking a ritonavir- or cobicistat-containing medicine to treat hepatitis C or Human Immunodeficiency Virus (HIV), you should continue to take your medicine as prescribed by your healthcare provider. 2 Revised: 24 August 2020    Talk to your healthcare provider if you do not feel better or if you feel worse after 5 days.  Who should generally not take PAXLOVID? Do not take PAXLOVID if: ? You are allergic to nirmatrelvir, ritonavir, or any of the ingredients in PAXLOVID. ? You are taking any of the following medicines: o Alfuzosin o Pethidine, propoxyphene o Ranolazine o Amiodarone, dronedarone, flecainide, propafenone, quinidine o Colchicine o Lurasidone, pimozide, clozapine o Dihydroergotamine, ergotamine, methylergonovine o Lovastatin, simvastatin o Sildenafil (Revatio) for pulmonary arterial hypertension (PAH) o Triazolam, oral midazolam o Apalutamide o Carbamazepine, phenobarbital, phenytoin o Rifampin o St. John's Wort (hypericum perforatum) Taking PAXLOVID with these medicines may cause serious or life-threatening side effects or affect how PAXLOVID works.  These are not the only medicines that may cause serious side effects if taken with PAXLOVID. PAXLOVID may increase or decrease the levels of multiple other medicines. It is very important to tell your healthcare provider about all of the medicines you are taking because additional laboratory tests or changes in the dose of your other medicines may be necessary while you are taking PAXLOVID. Your healthcare provider may also tell you about specific symptoms to watch out for that may indicate that you need to stop or decrease the dose of some of your other  medicines.  What are the important possible side effects of PAXLOVID? Possible side effects of PAXLOVID are: ? Allergic Reactions. Allergic reactions can happen in people taking PAXLOVID, even after only 1 dose. Stop taking PAXLOVID and call your healthcare provider right away if you get any of the following symptoms of an allergic reaction: o hives o trouble swallowing or breathing o swelling of the mouth, lips, or face o throat tightness o hoarseness 3 Revised: 24 August 2020  o skin rash ? Liver Problems. Tell your healthcare provider right away if you have any of these signs and symptoms of liver problems: loss of appetite, yellowing of your skin and the whites of eyes (jaundice), dark-colored urine, pale colored stools and itchy skin, stomach area (abdominal) pain. ? Resistance to HIV Medicines. If you have untreated HIV infection, PAXLOVID may lead to some HIV medicines not working as well in the future. ? Other possible side effects include: o altered sense of taste o diarrhea o high blood pressure o muscle aches These  are not all the possible side effects of PAXLOVID. Not many people have taken PAXLOVID. Serious and unexpected side effects may happen. PAXLOVID is still being studied, so it is possible that all of the risks are not known at this time.  What other treatment choices are there? Veklury (remdesivir) is FDA-approved for the treatment of mild-to-moderate XFGHW-29 in certain adults and children. Talk with your doctor to see if Marijean Heath is appropriate for you. Like PAXLOVID, FDA may also allow for the emergency use of other medicines to treat people with COVID-19. Go to https://price.info/ for information on the emergency use of other medicines that are authorized by FDA to treat people with COVID-19. Your healthcare provider may talk with you about  clinical trials for which you may be eligible. It is your choice to be treated or not to be treated with PAXLOVID. Should you decide not to receive it or for your child not to receive it, it will not change your standard medical care.  What if I am pregnant or breastfeeding? There is no experience treating pregnant women or breastfeeding mothers with PAXLOVID. For a mother and unborn baby, the benefit of taking PAXLOVID may be greater than the risk from the treatment. If you are pregnant, discuss your options and specific situation with your healthcare provider. It is recommended that you use effective barrier contraception or do not have sexual activity while taking PAXLOVID. If you are breastfeeding, discuss your options and specific situation with your healthcare provider. 4 Revised: 24 August 2020   How do I report side effects with PAXLOVID? Contact your healthcare provider if you have any side effects that bother you or do not go away. Report side effects to FDA MedWatch at SmoothHits.hu or call 1-800-FDA1088 or you can report side effects to [employer]. at the contact information provided below. Website Fax number Telephone number www.pfizersafetyreporting.com [phone] How should I store [place]? Store PAXLOVID tablets at room temperature, between 68?F to 77?F (20?C to 25?C). How can I learn more about COVID-19? ? Ask your healthcare provider. ? Visit [email or web address]-[email or web address] ? Contact your local or state public health department. What is an Emergency Use Authorization (EUA)? The [place] FDA has made PAXLOVID available under an emergency access mechanism called an Emergency Use Authorization (EUA). The EUA is supported by a Education officer, museum and Human Service (HHS) declaration that circumstances exist to justify the emergency use of drugs and biological products during the COVID-19 pandemic. PAXLOVID for the treatment of  mild-to-moderate COVID-19 in adults and children [80 years of age and older weighing at least 76 pounds (74 kg)] with positive results of direct SARS-CoV-2 viral testing, and who are at high risk for progression to severe COVID-19, including hospitalization or death, has not undergone the same type of review as an FDA-approved product. In issuing an EUA under the NIDPO-24 public health emergency, the FDA has determined, among other things, that based on the total amount of scientific evidence available including data from adequate and well-controlled clinical trials, if available, it is reasonable to believe that the product may be effective for diagnosing, treating, or preventing COVID-19, or a serious or life-threatening disease or condition caused by COVID-19; that the known and potential benefits of the product, when used to diagnose, treat, or prevent such disease or condition, outweigh the known and potential risks of such product; and that there are no adequate, approved, and available alternatives. All of these criteria must be met to allow for the product to be  used in the treatment of patients during the COVID-19 pandemic. The EUA for PAXLOVID is in effect for the duration of the COVID-19 declaration justifying emergency use of this product, unless terminated or revoked (after which the products may no longer be used under the EUA). 5 Revised: 24 August 2020     Additional Information For general questions, visit the website or call the telephone number provided below. Website Telephone number www.COVID19oralRx.com ([phone] (1-877-C19-PACK) You can also go to www.pfizermedinfo.com or call [phone] for more information. [id number] Revised: 24 August 2020

## 2021-02-14 DIAGNOSIS — E785 Hyperlipidemia, unspecified: Secondary | ICD-10-CM | POA: Diagnosis not present

## 2021-02-14 LAB — LIPID PANEL
Chol/HDL Ratio: 2.5 ratio (ref 0.0–5.0)
Cholesterol, Total: 124 mg/dL (ref 100–199)
HDL: 49 mg/dL (ref >39)
LDL Chol Calc (NIH): 59 mg/dL (ref 0–99)
Triglycerides: 81 mg/dL (ref 0–149)
VLDL Cholesterol Cal: 16 mg/dL (ref 5–40)

## 2021-02-20 ENCOUNTER — Encounter (HOSPITAL_BASED_OUTPATIENT_CLINIC_OR_DEPARTMENT_OTHER): Payer: Self-pay | Admitting: Internal Medicine

## 2021-02-20 ENCOUNTER — Ambulatory Visit (HOSPITAL_BASED_OUTPATIENT_CLINIC_OR_DEPARTMENT_OTHER): Payer: Medicare HMO | Admitting: Internal Medicine

## 2021-02-20 ENCOUNTER — Other Ambulatory Visit: Payer: Self-pay

## 2021-02-20 VITALS — BP 120/82 | HR 68 | Ht 70.0 in | Wt 180.8 lb

## 2021-02-20 DIAGNOSIS — T466X5D Adverse effect of antihyperlipidemic and antiarteriosclerotic drugs, subsequent encounter: Secondary | ICD-10-CM

## 2021-02-20 DIAGNOSIS — M791 Myalgia, unspecified site: Secondary | ICD-10-CM | POA: Diagnosis not present

## 2021-02-20 DIAGNOSIS — E785 Hyperlipidemia, unspecified: Secondary | ICD-10-CM

## 2021-02-20 DIAGNOSIS — T466X5A Adverse effect of antihyperlipidemic and antiarteriosclerotic drugs, initial encounter: Secondary | ICD-10-CM

## 2021-02-20 DIAGNOSIS — R931 Abnormal findings on diagnostic imaging of heart and coronary circulation: Secondary | ICD-10-CM | POA: Diagnosis not present

## 2021-02-20 NOTE — Progress Notes (Signed)
LIPID CLINIC CONSULT NOTE  Chief Complaint:  Follow-up dyslipidemia  Primary Care Physician: Marin Olp, MD  Primary Cardiologist:  None  HPI:  Mike Hall is a 70 y.o. male who is being seen today for the evaluation of dyslipidemia at the request of Marin Olp, MD.  This is a pleasant 70 year old kindly referred for management of dyslipidemia.  He has a family history of heart disease in his father who had MI at age 28.  Recent labs showed a total cholesterol 157, triglycerides 83, HDL 48 and LDL of 93.  He did undergo CT coronary calcium scoring in June 2021 showing total calcium score of 752, 87th percentile for age and sex matched control.  Calcification was noted in the LAD and circumflex distributions.  Currently he reports some dyspnea and possibly some progressive fatigue.  Is been present over the past 6 months or so.  It may be slightly worse since he realized that he had some coronary artery disease.  04/12/2020  [name] returns today for follow-up of dyslipidemia.  He has had an excellent response to his lipids.  He is currently on rosuvastatin 40 mg daily.  His total cholesterol is 157, triglycerides 107, HDL 48 and LDL of 85.  This is down from an LDL of 157 in the past.  His target is less than 70 and I do think we could possibly achieve that with additional titration of his statin.  With regards to some dyspnea he was having during his previous office visit and his high coronary calcium score, I recommended a nuclear stress test.  This was performed on February 23, 2020 and did not demonstrate any reversible ischemia.  Since then he reports his dyspnea has improved.  10/15/2020  [name] returns today for follow-up.  He says he has been doing very well on Praluent.  He says is better tolerated than the statin and he denies any other side effects he was previously having on higher dose rosuvastatin.  Repeat lipids now showed total cholesterol 149, HDL 50,  LDL 84 and triglycerides 78.  Based on his high calcium score, his target LDL is less than 70.  We have not yet achieved that.  02/20/2021  [name] is seen today in follow-up.  He is doing quite well on low-dose rosuvastatin.  After adding that to his Praluent LDL is now at target.  Total cholesterol 124, HDL 49, triglycerides 81 and LDL 59.  Initially thought he had some tightness across his shoulder blades on the medicine however that resolved and he generally feels like he is tolerating the statin well.  Blood pressure is excellent today.  He denies any chest pain or worsening shortness of breath.  PMHx:  Past Medical History:  Diagnosis Date   Hyperlipidemia    Pre-syncope     Past Surgical History:  Procedure Laterality Date   COLONOSCOPY  04/18/2015   Stark-polyp   none     POLYPECTOMY      FAMHx:  Family History  Problem Relation Age of Onset   Uterine cancer Mother        83   Coronary artery disease Father        age 25   Diabetes Mellitus I Daughter    Hypertension Brother    Colon cancer Neg Hx    Colon polyps Neg Hx    Esophageal cancer Neg Hx    Rectal cancer Neg Hx    Stomach cancer Neg  Hx     SOCHx:   reports that he has never smoked. He has never used smokeless tobacco. He reports that he does not drink alcohol and does not use drugs.  ALLERGIES:  No Known Allergies  ROS: Pertinent items noted in HPI and remainder of comprehensive ROS otherwise negative.  HOME MEDS: Current Outpatient Medications on File Prior to Visit  Medication Sig Dispense Refill   aspirin EC 81 MG tablet Take 81 mg by mouth daily. Swallow whole.     cholecalciferol (VITAMIN D3) 25 MCG (1000 UT) tablet Take 2,000 Units by mouth daily.      Magnesium Gluconate (MAGONATE PO) Take 800 mg by mouth.     PRALUENT 150 MG/ML SOAJ INJECT 1 DOSE INTO THE SKIN EVERY 14 (FOURTEEN) DAYS. 6 mL 3   rosuvastatin (CRESTOR) 5 MG tablet Take 1 tablet (5 mg total) by mouth every other day. 45  tablet 3   benzonatate (TESSALON PERLES) 100 MG capsule Take 1 capsule (100 mg total) by mouth 3 (three) times daily as needed. (Patient not taking: Reported on 02/20/2021) 20 capsule 0   No current facility-administered medications on file prior to visit.    LABS/IMAGING: No results found for this or any previous visit (from the past 48 hour(s)). No results found.  LIPID PANEL:    Component Value Date/Time   CHOL 124 02/14/2021 0842   CHOL 189 12/20/2012 1541   TRIG 81 02/14/2021 0842   TRIG 278 (H) 05/16/2013 1514   TRIG 164 (H) 12/20/2012 1541   HDL 49 02/14/2021 0842   HDL 37 (L) 05/16/2013 1514   HDL 41 12/20/2012 1541   CHOLHDL 2.5 02/14/2021 0842   CHOLHDL 2 11/06/2020 0943   VLDL 15.0 11/06/2020 0943   LDLCALC 59 02/14/2021 0842   LDLCALC 54 05/16/2013 1514   LDLCALC 115 (H) 12/20/2012 1541    WEIGHTS: Wt Readings from Last 3 Encounters:  02/20/21 180 lb 12.8 oz (82 kg)  11/06/20 179 lb 4 oz (81.3 kg)  10/15/20 183 lb 9.6 oz (83.3 kg)    VITALS: BP 120/82   Pulse 68   Ht '5\' 10"'$  (1.778 m)   Wt 180 lb 12.8 oz (82 kg)   SpO2 99%   BMI 25.94 kg/m   EXAM: Deferred  EKG: Deferred  ASSESSMENT: Progressive dyspnea on exertion -low risk Myoview stress test without ischemia (02/2020) High CAC score 752, 87 percentile for age and sex matched control (11/2019) Dyslipidemia goal LDL less than 70 History of possible myalgias on Lipitor Family history of heart disease in his father  PLAN: 1.   Mike Hall is doing well and is asymptomatic.  He is on a combination of low-dose rosuvastatin and Praluent with an LDL cholesterol well below 70.  He is tolerating medications without any issues.  Overall he remains asymptomatic and is physically active.  We will plan follow-up in a year or sooner if he needs me.  He has a follow-up with his primary care provider for physical in June 2023 and will likely have lab work at that time which should be sufficient for me if he has a  fasting lipid profile for follow-up in September 2023.  Pixie Casino, MD, [facility], [place] Director of the Advanced Lipid Disorders &  Cardiovascular Risk Reduction Clinic Diplomate of the American Board of Clinical Lipidology Attending Cardiologist  Direct Dial: [phone]  Fax: [phone]  Website:  [email or web address]  Pixie Casino 02/20/2021,  9:35 AM

## 2021-02-20 NOTE — Patient Instructions (Signed)
Medication Instructions:  No Changes In Medications at this time.  *If you need a refill on your cardiac medications before your next appointment, please call your pharmacy*  Follow-Up: At [facility], you and your health needs are our priority.  As part of our continuing mission to provide you with exceptional heart care, we have created designated Provider Care Teams.  These Care Teams include your primary Cardiologist (physician) and Advanced Practice Providers (APPs -  Physician Assistants and Nurse Practitioners) who all work together to provide you with the care you need, when you need it.  Your next appointment:   1 year(s)  The format for your next appointment:   In Person  Provider:   K. Mali Hilty, MD

## 2021-02-21 ENCOUNTER — Other Ambulatory Visit: Payer: Self-pay | Admitting: Internal Medicine

## 2021-02-21 DIAGNOSIS — E785 Hyperlipidemia, unspecified: Secondary | ICD-10-CM

## 2021-02-22 ENCOUNTER — Telehealth: Payer: Medicare HMO | Admitting: Family Medicine

## 2021-04-15 ENCOUNTER — Ambulatory Visit (INDEPENDENT_AMBULATORY_CARE_PROVIDER_SITE_OTHER): Payer: Medicare HMO | Admitting: Family Medicine

## 2021-04-15 ENCOUNTER — Encounter: Payer: Self-pay | Admitting: Family Medicine

## 2021-04-15 ENCOUNTER — Other Ambulatory Visit: Payer: Self-pay

## 2021-04-15 VITALS — BP 102/70 | HR 65 | Temp 98.1°F | Ht 70.0 in | Wt 182.2 lb

## 2021-04-15 DIAGNOSIS — R058 Other specified cough: Secondary | ICD-10-CM | POA: Diagnosis not present

## 2021-04-15 DIAGNOSIS — I25119 Atherosclerotic heart disease of native coronary artery with unspecified angina pectoris: Secondary | ICD-10-CM

## 2021-04-15 DIAGNOSIS — J329 Chronic sinusitis, unspecified: Secondary | ICD-10-CM | POA: Diagnosis not present

## 2021-04-15 DIAGNOSIS — E785 Hyperlipidemia, unspecified: Secondary | ICD-10-CM | POA: Diagnosis not present

## 2021-04-15 DIAGNOSIS — B9689 Other specified bacterial agents as the cause of diseases classified elsewhere: Secondary | ICD-10-CM

## 2021-04-15 MED ORDER — AMOXICILLIN-POT CLAVULANATE 875-125 MG PO TABS: 1.0000 | ORAL_TABLET | Freq: Two times a day (BID) | ORAL | 0 refills | Status: AC

## 2021-04-15 NOTE — Patient Instructions (Addendum)
Health Maintenance Due  Topic Date Due   Zoster Vaccines- Shingrix (1 of 2)-   - Please consider and check with your local pharmacy to see if they have the shingrix vaccine. If they do- please get this immunization and update [place] by phone call or mychart with dates you receive the vaccine. Never done   TETANUS/TDAP -  - Please consider getting your tdap shot at your local pharmacy. When you receive, please send [place] the date so we can put in our system.  07/10/2018   COVID-19 Vaccine (3 - Booster for Moderna series)-   - Consider getting bivalent booster shot at your local pharmacy within 3 months since you recently had [place].  OR -You can wait until 6 months to receive.  07/04/2020   Please trial Augmentin twice a day and take for 7 days. Please update me on any new,worsening or persistent symptoms. I wonder if this is all still draining from sinsuses.   Please try Flonase as well for potential allergies (on top of sinusitis)  I hope you feel better!   Recommended follow up: Return for as needed for new, worsening, persistent symptoms.

## 2021-04-15 NOTE — Progress Notes (Signed)
Phone [phone] In person visit   Subjective:   Mike Hall is a 70 y.o. year old very pleasant male patient who presents for/with See problem oriented charting Chief Complaint  Patient presents with   Cough    Pt c/o cough x2 weeks with chest congestion.    This visit occurred during the SARS-CoV-2 public health emergency.  Safety protocols were in place, including screening questions prior to the visit, additional usage of staff PPE, and extensive cleaning of exam room while observing appropriate contact time as indicated for disinfecting solutions.   Past Medical History-  Patient Active Problem List   Diagnosis Date Noted   Hyperlipidemia 12/20/2012    Priority: Medium    History of adenomatous polyp of colon 09/18/2014    Priority: Low   Dizziness 12/07/2018   Vitamin D deficiency 12/02/2017   Constipation 09/25/2015    Medications- reviewed and updated Current Outpatient Medications  Medication Sig Dispense Refill   amoxicillin-clavulanate (AUGMENTIN) 875-125 MG tablet Take 1 tablet by mouth 2 (two) times daily for 7 days. 14 tablet 0   aspirin EC 81 MG tablet Take 81 mg by mouth daily. Swallow whole.     cholecalciferol (VITAMIN D3) 25 MCG (1000 UT) tablet Take 2,000 Units by mouth daily.      Magnesium Gluconate (MAGONATE PO) Take 800 mg by mouth.     PRALUENT 150 MG/ML SOAJ INJECT 1 DOSE INTO THE SKIN EVERY 14 (FOURTEEN) DAYS. 6 mL 3   rosuvastatin (CRESTOR) 5 MG tablet Take 1 tablet (5 mg total) by mouth every other day. 45 tablet 3   No current facility-administered medications for this visit.     Objective:  BP 102/70   Pulse 65   Temp 98.1 F (36.7 C)   Ht 5\' 10"  (1.778 m)   Wt 182 lb 3.2 oz (82.6 kg)   SpO2 96%   BMI 26.14 kg/m  Gen: NAD, resting comfortably No sinus pressure today.  Some clear discharge in bilateral nares-at posterior pharynx mild erythema/streaking consistent with drainage.  Oropharynx otherwise normal CV: RRR no murmurs rubs  or gallops Lungs: CTAB no crackles, wheeze, rhonchi Abdomen: soft/nontender/nondistended/normal bowel sounds. No rebound or guarding.  Ext: no edema Skin: warm, dry    Assessment and Plan   # Ongoing cough S:Patient is covid negative x3 on home tests (plus had covid in august so less likely).  Symptoms for about 2 weeks. Feels like having a deep cough with chest congestion. Congestion in chest is improving but still coughing a fair amount. No fever or chills. Had flu shot day he started feeling poor. Mucinex- Dm doesn't help much and caused itchy skin. No asthma or copd history. Dry or clear cough when does cough. some sinus pressure/headaches but improving. Symptoms do pick up in the evening. Slight wheeze at times. Tessalon tried leftover from covid- not helping too much -never smoker -No recent sick contacts.  A/P: 70 year old male overall pretty healthy other than elevated coronary artery calcium score and hyperlipidemia presenting with cough for [redacted] weeks along with nasal congestion/drainage-has had some significant sinus pressure which seems to be improving but based on exam seems to have some drainage from sinuses posteriorly still-I think this could be bacterial sinusitis and we will cover with Augmentin for 7 days-also was cleaning leaves about a week ago (after symptoms started) and wonder if he could have an element of allergies-trial Flonase on top of Augmentin. - If fails to improve but still not having  any shortness of breath and chest congestion continues to have resolved-consider prednisone for postviral cough - If fails to improve-could consider x-ray.  Lungs were clear today and we opted out of this -No history of asthma and never smoker but having some slight wheeze-offered inhaler but he would like to hold off for now and see how he does with the Augmentin -They also asked me about COVID vaccination-since he had COVID in August and this was rather mild I think waiting at least 6  months for COVID vaccination is reasonable-they are even considering waiting until next  fall  #CAD-coronary artery calcium score of 752 which is 87th percentile for age November 17, 2019 followed with Dr. Hilty/lipid clincic #hyperlipidemia S: Medication: Praluent 150 mg every 14 days, aspirin 81 mg daily, Crestor 5 mg every other day started in late April  No chest pain or shortness of breath noted Lab Results  Component Value Date   CHOL 124 02/14/2021   HDL 49 02/14/2021   LDLCALC 59 02/14/2021   TRIG 81 02/14/2021   CHOLHDL 2.5 02/14/2021   A/P: CAD remains asymptomatic.  No shortness of breath even with recent illness.  Hyperlipidemia has been well controlled-continue current medication    Recommended follow up: Return for as needed for new, worsening, persistent symptoms. Future Appointments  Date Time Provider [place]  11/13/2021  9:20 AM Marin Olp, MD [facility]  11/14/2021 10:15 AM [facility] HEALTH COACH [facility]    Lab/Order associations:   ICD-10-CM   1. Hyperlipidemia, unspecified hyperlipidemia type  E78.5     2. Other cough  R05.8     3. Coronary artery disease with angina pectoris, unspecified vessel or lesion type, unspecified whether native or transplanted heart ([place])  I25.119     4. History of vertigo  Z87.898     5. Vitamin D deficiency  E55.9       Meds ordered this encounter  Medications   amoxicillin-clavulanate (AUGMENTIN) 875-125 MG tablet    Sig: Take 1 tablet by mouth 2 (two) times daily for 7 days.    Dispense:  14 tablet    Refill:  0    I,Jada Bradford,acting as a scribe for Garret Reddish, MD.,have documented all relevant documentation on the behalf of Garret Reddish, MD,as directed by  Garret Reddish, MD while in the presence of Garret Reddish, MD.   I, Garret Reddish, MD, have reviewed all documentation for this visit. The documentation on 04/15/21 for the exam, diagnosis, procedures, and orders are all accurate and  complete.   Return precautions advised.  Garret Reddish, MD

## 2021-06-12 DIAGNOSIS — K5900 Constipation, unspecified: Secondary | ICD-10-CM | POA: Diagnosis not present

## 2021-06-12 DIAGNOSIS — R030 Elevated blood-pressure reading, without diagnosis of hypertension: Secondary | ICD-10-CM | POA: Diagnosis not present

## 2021-06-12 DIAGNOSIS — Z7982 Long term (current) use of aspirin: Secondary | ICD-10-CM | POA: Diagnosis not present

## 2021-06-12 DIAGNOSIS — Z85828 Personal history of other malignant neoplasm of skin: Secondary | ICD-10-CM | POA: Diagnosis not present

## 2021-06-12 DIAGNOSIS — Z809 Family history of malignant neoplasm, unspecified: Secondary | ICD-10-CM | POA: Diagnosis not present

## 2021-06-12 DIAGNOSIS — E785 Hyperlipidemia, unspecified: Secondary | ICD-10-CM | POA: Diagnosis not present

## 2021-06-12 DIAGNOSIS — Z8249 Family history of ischemic heart disease and other diseases of the circulatory system: Secondary | ICD-10-CM | POA: Diagnosis not present

## 2021-06-14 DIAGNOSIS — Z86018 Personal history of other benign neoplasm: Secondary | ICD-10-CM | POA: Diagnosis not present

## 2021-06-14 DIAGNOSIS — L578 Other skin changes due to chronic exposure to nonionizing radiation: Secondary | ICD-10-CM | POA: Diagnosis not present

## 2021-06-14 DIAGNOSIS — D2271 Melanocytic nevi of right lower limb, including hip: Secondary | ICD-10-CM | POA: Diagnosis not present

## 2021-06-14 DIAGNOSIS — L570 Actinic keratosis: Secondary | ICD-10-CM | POA: Diagnosis not present

## 2021-06-14 DIAGNOSIS — D225 Melanocytic nevi of trunk: Secondary | ICD-10-CM | POA: Diagnosis not present

## 2021-06-14 DIAGNOSIS — L821 Other seborrheic keratosis: Secondary | ICD-10-CM | POA: Diagnosis not present

## 2021-11-07 ENCOUNTER — Encounter: Payer: Medicare HMO | Admitting: Family Medicine

## 2021-11-13 ENCOUNTER — Ambulatory Visit (INDEPENDENT_AMBULATORY_CARE_PROVIDER_SITE_OTHER): Payer: Medicare HMO | Admitting: Family Medicine

## 2021-11-13 ENCOUNTER — Encounter: Payer: Self-pay | Admitting: Family Medicine

## 2021-11-13 VITALS — BP 118/70 | HR 58 | Temp 98.2°F | Ht 70.0 in | Wt 178.6 lb

## 2021-11-13 DIAGNOSIS — I2510 Atherosclerotic heart disease of native coronary artery without angina pectoris: Secondary | ICD-10-CM | POA: Diagnosis not present

## 2021-11-13 DIAGNOSIS — Z0000 Encounter for general adult medical examination without abnormal findings: Secondary | ICD-10-CM

## 2021-11-13 DIAGNOSIS — N529 Male erectile dysfunction, unspecified: Secondary | ICD-10-CM | POA: Diagnosis not present

## 2021-11-13 DIAGNOSIS — R351 Nocturia: Secondary | ICD-10-CM

## 2021-11-13 DIAGNOSIS — E785 Hyperlipidemia, unspecified: Secondary | ICD-10-CM

## 2021-11-13 DIAGNOSIS — E559 Vitamin D deficiency, unspecified: Secondary | ICD-10-CM

## 2021-11-13 DIAGNOSIS — R6882 Decreased libido: Secondary | ICD-10-CM

## 2021-11-13 DIAGNOSIS — R69 Illness, unspecified: Secondary | ICD-10-CM | POA: Diagnosis not present

## 2021-11-13 LAB — COMPREHENSIVE METABOLIC PANEL
ALT: 21 U/L (ref 0–53)
AST: 25 U/L (ref 0–37)
Albumin: 4.5 g/dL (ref 3.5–5.2)
Alkaline Phosphatase: 56 U/L (ref 39–117)
BUN: 18 mg/dL (ref 6–23)
CO2: 29 mEq/L (ref 19–32)
Calcium: 9.6 mg/dL (ref 8.4–10.5)
Chloride: 103 mEq/L (ref 96–112)
Creatinine, Ser: 0.95 mg/dL (ref 0.40–1.50)
GFR: 82.02 mL/min (ref >60.00)
Glucose, Bld: 98 mg/dL (ref 70–99)
Potassium: 4.6 mEq/L (ref 3.5–5.1)
Sodium: 138 mEq/L (ref 135–145)
Total Bilirubin: 0.8 mg/dL (ref 0.2–1.2)
Total Protein: 6.8 g/dL (ref 6.0–8.3)

## 2021-11-13 LAB — CBC WITH DIFFERENTIAL/PLATELET
Basophils Absolute: 0 10*3/uL (ref 0.0–0.1)
Basophils Relative: 0.6 % (ref 0.0–3.0)
Eosinophils Absolute: 0.1 10*3/uL (ref 0.0–0.7)
Eosinophils Relative: 2.3 % (ref 0.0–5.0)
HCT: 39.4 % (ref 39.0–52.0)
Hemoglobin: 13.4 g/dL (ref 13.0–17.0)
Lymphocytes Relative: 36.5 % (ref 12.0–46.0)
Lymphs Abs: 1.7 10*3/uL (ref 0.7–4.0)
MCHC: 34.1 g/dL (ref 30.0–36.0)
MCV: 88.2 fl (ref 78.0–100.0)
Monocytes Absolute: 0.5 10*3/uL (ref 0.1–1.0)
Monocytes Relative: 9.7 % (ref 3.0–12.0)
Neutro Abs: 2.4 10*3/uL (ref 1.4–7.7)
Neutrophils Relative %: 50.9 % (ref 43.0–77.0)
Platelets: 169 10*3/uL (ref 150.0–400.0)
RBC: 4.47 Mil/uL (ref 4.22–5.81)
RDW: 13.4 % (ref 11.5–15.5)
WBC: 4.7 10*3/uL (ref 4.0–10.5)

## 2021-11-13 LAB — LIPID PANEL
Cholesterol: 122 mg/dL (ref 0–200)
HDL: 48 mg/dL (ref >39.00)
LDL Cholesterol: 58 mg/dL (ref 0–99)
NonHDL: 73.82
Total CHOL/HDL Ratio: 3
Triglycerides: 81 mg/dL (ref 0.0–149.0)
VLDL: 16.2 mg/dL (ref 0.0–40.0)

## 2021-11-13 LAB — PSA: PSA: 0.6 ng/mL (ref 0.10–4.00)

## 2021-11-13 LAB — VITAMIN D 25 HYDROXY (VIT D DEFICIENCY, FRACTURES): VITD: 58.05 ng/mL (ref 30.00–100.00)

## 2021-11-13 NOTE — Patient Instructions (Addendum)
Let us know when you get your [facility] and TDAP at your pharmacy.  Rosuvastatin 5 mg- can trial off for up to a month then restart - if recurs with restart then would consider spacing out more or alternate statin -consider physical therapy as well if he doesn't improve or sports medicine  Thanks for doing labs  Recommended follow up: Return in about 1 year (around 11/14/2022) for physical or sooner if needed.Schedule b4 you leave.

## 2021-11-13 NOTE — Progress Notes (Signed)
Phone: [phone]   Subjective:  Patient presents today for their annual physical. Chief complaint-noted.   See problem oriented charting- ROS- full  review of systems was completed and negative  except for: neck pain and stiffness (worse on statin)  The following were reviewed and entered/updated in epic: Past Medical History:  Diagnosis Date   Hyperlipidemia    Pre-syncope    Patient Active Problem List   Diagnosis Date Noted   CAD (coronary artery disease) 11/13/2021    Priority: High   Vitamin D deficiency 12/02/2017    Priority: Medium    Hyperlipidemia 12/20/2012    Priority: Medium    Constipation 09/25/2015    Priority: Low   History of adenomatous polyp of colon 09/18/2014    Priority: Low   Dizziness 12/07/2018   Past Surgical History:  Procedure Laterality Date   COLONOSCOPY  04/18/2015   Stark-polyp   none     POLYPECTOMY      Family History  Problem Relation Age of Onset   Uterine cancer Mother        76   Coronary artery disease Father        age 90   Diabetes Mellitus I Daughter    Hypertension Brother    Colon cancer Neg Hx    Colon polyps Neg Hx    Esophageal cancer Neg Hx    Rectal cancer Neg Hx    Stomach cancer Neg Hx     Medications- reviewed and updated Current Outpatient Medications  Medication Sig Dispense Refill   aspirin EC 81 MG tablet Take 81 mg by mouth daily. Swallow whole.     cholecalciferol (VITAMIN D3) 25 MCG (1000 UT) tablet Take 2,000 Units by mouth daily.      Magnesium Gluconate (MAGONATE PO) Take 800 mg by mouth.     PRALUENT 150 MG/ML SOAJ INJECT 1 DOSE INTO THE SKIN EVERY 14 (FOURTEEN) DAYS. 6 mL 3   rosuvastatin (CRESTOR) 5 MG tablet Take 1 tablet (5 mg total) by mouth every other day. 45 tablet 3   No current facility-administered medications for this visit.    Allergies-reviewed and updated No Known Allergies  Social History   Social History Narrative   Married. 1 daughter. No grandkids.    GED       Works in [employer] in [place]. 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       Hobbies: time at home, yardwork   Objective  Objective:  BP 118/70   Pulse (!) 58   Temp 98.2 F (36.8 C)   Ht '5\' 10"'$  (1.778 m)   Wt 178 lb 9.6 oz (81 kg)   SpO2 95%   BMI 25.63 kg/m  Gen: NAD, resting comfortably HEENT: Mucous membranes are moist. Oropharynx normal Neck: no thyromegaly CV: RRR no murmurs rubs or gallops Lungs: CTAB no crackles, wheeze, rhonchi Abdomen: soft/nontender/nondistended/normal bowel sounds. No rebound or guarding.  Ext: no edema Skin: warm, dry Neuro: grossly normal, moves all extremities, PERRLA Msk: Muscular tension in the neck bilaterally and musculature but thankfully no midline pain   Assessment and Plan  71 y.o. male presenting for annual physical.  Health Maintenance counseling: 1. Anticipatory guidance: Patient counseled regarding regular dental exams -q6 months, eye exams -yearly,  avoiding smoking and second hand smoke , limiting alcohol to 2 beverages per day -does not drink, no illicit drugs .   2. Risk factor reduction:  Advised patient of need for regular exercise and diet rich and fruits and vegetables  to reduce risk of heart attack and stroke.  Exercise- 2.5 miles a day walking with wife.  Diet/weight management-weight down 1 pound from last year-reasonable weight for his muscle mass. Focus more on maintenance or mild weight loss Wt Readings from Last 3 Encounters:  11/13/21 178 lb 9.6 oz (81 kg)  04/15/21 182 lb 3.2 oz (82.6 kg)  02/20/21 180 lb 12.8 oz (82 kg)  3. Immunizations/screenings/ancillary studies- had covid in august- is going to wait untilt he fall at least, discussed shingrix and tdap at pharmacy   Immunization History  Administered Date(s) Administered   Influenza Split 03/03/2013   Influenza, High Dose Seasonal PF 03/19/2018   Influenza,inj,Quad PF,6+ Mos 02/28/2014, 03/08/2015, 02/26/2016, 03/17/2017   Influenza-Unspecified 03/19/2018,  03/01/2019   Moderna SARS-COV2 Booster Vaccination 05/09/2020   Moderna Sars-Covid-2 Vaccination 07/13/2019, 08/11/2019   PNEUMOCOCCAL CONJUGATE-20 11/06/2020   Zoster, Live 05/16/2013  4. Prostate cancer screening- low risk prior PSA trend-update PSA with labs today Lab Results  Component Value Date   PSA 0.43 11/06/2020   PSA 0.39 11/03/2019   PSA 0.57 08/23/2018   5. Colon cancer screening - history of adenomatous colon polyp 2016 and then had repeat 10/11/2020 with plan for 3-year follow-up 6. Skin cancer screening- sees Dr. Delman Cheadle yearly . advised regular sunscreen use. Denies worrisome, changing, or new skin lesions.  7. Smoking associated screening (lung cancer screening, AAA screen 65-75, UA)-never smoker 8. STD screening -monogamous so declines  Status of chronic or acute concerns   #CAD-coronary artery calcium score of 752 which is 87th percentile for age November 17, 2019 follows with Dr. Hilty/lipid clincic #hyperlipidemia S: Medication: Praluent 150 mg every 14 days, aspirin 81 mg, Crestor 5 mg every other day started in late April 2022 -No chest pain shortness of breath reported  - has noted some neck pain and shoulder pain for a while - slightly worse in last month. Doesn't recall having issue before statin Lab Results  Component Value Date   CHOL 124 02/14/2021   HDL 49 02/14/2021   LDLCALC 59 02/14/2021   TRIG 81 02/14/2021   CHOLHDL 2.5 02/14/2021    A/P:CAD asymptomatic-continue current medication except see below about statin  Hyperlipidemia well-controlled at  the lipid clinic on last check-offered repeat today and opted in- he is having some myalgias and sometimes coming off then restarting can help on statins- can trial off for up to a month then restart - if recurs with restart then would consider spacing out more or alternate statin -consider physical therapy as well if he doesn't improve or sports medicine  #Vitamin D deficiency S: Medication: 2000 units a  day Last vitamin D Lab Results  Component Value Date   VD25OH 38.42 11/06/2020  A/P: Well-controlled on last check-update vitamin D with labs today and continue current medication  #Low libido for close to a year. No morning erections in last year. He wonders if this is related to starting statin- with trial off above he will update me and let me know- plus checking testosterone  Recommended follow up: Return in about 1 year (around 11/14/2022) for physical or sooner if needed.Schedule b4 you leave. Future Appointments  Date Time Provider [place]  11/14/2021 10:00 AM [facility] HEALTH COACH [facility]  03/13/2022  9:30 AM Hilty, Nadean Corwin, MD [facility]  12/15/2022  8:20 AM Marin Olp, MD [facility]   Lab/Order associations: fasting   ICD-10-CM   1. Preventative health care  Z00.00  2. Coronary artery disease involving native coronary artery of native heart without angina pectoris  I25.10     3. Hyperlipidemia, unspecified hyperlipidemia type  E78.5 CBC with Differential/Platelet    Comprehensive metabolic panel    Lipid panel    4. Vitamin D deficiency  E55.9 VITAMIN D 25 Hydroxy (Vit-D Deficiency, Fractures)    5. Low libido  R68.82 Testosterone Total,Free,Bio, Males-([facility])    6. Erectile dysfunction, unspecified erectile dysfunction type  N52.9 Testosterone Total,Free,Bio, Males-([facility])    7. Nocturia  R35.1 PSA      No orders of the defined types were placed in this encounter.   Return precautions advised.  Garret Reddish, MD

## 2021-11-14 ENCOUNTER — Ambulatory Visit (INDEPENDENT_AMBULATORY_CARE_PROVIDER_SITE_OTHER): Payer: Medicare HMO

## 2021-11-14 DIAGNOSIS — Z0000 Encounter for general adult medical examination without abnormal findings: Secondary | ICD-10-CM

## 2021-11-14 LAB — TESTOSTERONE TOTAL,FREE,BIO, MALES
Albumin: 4.4 g/dL (ref 3.6–5.1)
Sex Hormone Binding: 35 nmol/L (ref 22–77)
Testosterone, Bioavailable: 101.2 ng/dL — ABNORMAL LOW (ref 110.0–575.0)
Testosterone, Free: 50.3 pg/mL (ref 46.0–224.0)
Testosterone: 401 ng/dL (ref 250–827)

## 2021-11-14 NOTE — Patient Instructions (Signed)
[name] , Thank you for taking time to come for your Medicare Wellness Visit. I appreciate your ongoing commitment to your health goals. Please review the following plan we discussed and let me know if I can assist you in the future.   Screening recommendations/referrals: Colonoscopy: Done 10/11/20 repeat every 3 years  Recommended yearly ophthalmology/optometry visit for glaucoma screening and checkup Recommended yearly dental visit for hygiene and checkup  Vaccinations: Influenza vaccine: Done 04/01/21 repeat every year  Pneumococcal vaccine: Up to date Tdap vaccine: Due and discussed  Shingles vaccine: Shingrix discussed. Please contact your pharmacy for coverage information.    Covid-19: 2/3, 3/4, 05/09/20  Advanced directives: Advance directive discussed with you today. Even though you declined this today please call our office should you change your mind and we can give you the proper paperwork for you to fill out.  Conditions/risks identified: Continue to stay healthy   Next appointment: Follow up in one year for your annual wellness visit.   Preventive Care 69 Years and Older, Male Preventive care refers to lifestyle choices and visits with your health care provider that can promote health and wellness. What does preventive care include? A yearly physical exam. This is also called an annual well check. Dental exams once or twice a year. Routine eye exams. Ask your health care provider how often you should have your eyes checked. Personal lifestyle choices, including: Daily care of your teeth and gums. Regular physical activity. Eating a healthy diet. Avoiding tobacco and drug use. Limiting alcohol use. Practicing safe sex. Taking low doses of aspirin every day. Taking vitamin and mineral supplements as recommended by your health care provider. What happens during an annual well check? The services and screenings done by your health care provider during your annual well  check will depend on your age, overall health, lifestyle risk factors, and family history of disease. Counseling  Your health care provider may ask you questions about your: Alcohol use. Tobacco use. Drug use. Emotional well-being. Home and relationship well-being. Sexual activity. Eating habits. History of falls. Memory and ability to understand (cognition). Work and work Statistician. Screening  You may have the following tests or measurements: Height, weight, and BMI. Blood pressure. Lipid and cholesterol levels. These may be checked every 5 years, or more frequently if you are over 69 years old. Skin check. Lung cancer screening. You may have this screening every year starting at age 69 if you have a 30-pack-year history of smoking and currently smoke or have quit within the past 15 years. Fecal occult blood test (FOBT) of the stool. You may have this test every year starting at age 69. Flexible sigmoidoscopy or colonoscopy. You may have a sigmoidoscopy every 5 years or a colonoscopy every 10 years starting at age 65. Prostate cancer screening. Recommendations will vary depending on your family history and other risks. Hepatitis C blood test. Hepatitis B blood test. Sexually transmitted disease (STD) testing. Diabetes screening. This is done by checking your blood sugar (glucose) after you have not eaten for a while (fasting). You may have this done every 1-3 years. Abdominal aortic aneurysm (AAA) screening. You may need this if you are a current or former smoker. Osteoporosis. You may be screened starting at age 69 if you are at high risk. Talk with your health care provider about your test results, treatment options, and if necessary, the need for more tests. Vaccines  Your health care provider may recommend certain vaccines, such as: Influenza vaccine. This is  recommended every year. Tetanus, diphtheria, and acellular pertussis (Tdap, Td) vaccine. You may need a Td booster  every 10 years. Zoster vaccine. You may need this after age 60. Pneumococcal 13-valent conjugate (PCV13) vaccine. One dose is recommended after age 18. Pneumococcal polysaccharide (PPSV23) vaccine. One dose is recommended after age 47. Talk to your health care provider about which screenings and vaccines you need and how often you need them. This information is not intended to replace advice given to you by your health care provider. Make sure you discuss any questions you have with your health care provider. Document Released: 06/22/2015 Document Revised: 02/13/2016 Document Reviewed: 03/27/2015 Elsevier Interactive Patient Education  2017 [place] Prevention in the Home Falls can cause injuries. They can happen to people of all ages. There are many things you can do to make your home safe and to help prevent falls. What can I do on the outside of my home? Regularly fix the edges of walkways and driveways and fix any cracks. Remove anything that might make you trip as you walk through a door, such as a raised step or threshold. Trim any bushes or trees on the path to your home. Use bright outdoor lighting. Clear any walking paths of anything that might make someone trip, such as rocks or tools. Regularly check to see if handrails are loose or broken. Make sure that both sides of any steps have handrails. Any raised decks and porches should have guardrails on the edges. Have any leaves, snow, or ice cleared regularly. Use sand or salt on walking paths during winter. Clean up any spills in your garage right away. This includes oil or grease spills. What can I do in the bathroom? Use night lights. Install grab bars by the toilet and in the tub and shower. Do not use towel bars as grab bars. Use non-skid mats or decals in the tub or shower. If you need to sit down in the shower, use a plastic, non-slip stool. Keep the floor dry. Clean up any water that spills on the floor as soon  as it happens. Remove soap buildup in the tub or shower regularly. Attach bath mats securely with double-sided non-slip rug tape. Do not have throw rugs and other things on the floor that can make you trip. What can I do in the bedroom? Use night lights. Make sure that you have a light by your bed that is easy to reach. Do not use any sheets or blankets that are too big for your bed. They should not hang down onto the floor. Have a firm chair that has side arms. You can use this for support while you get dressed. Do not have throw rugs and other things on the floor that can make you trip. What can I do in the kitchen? Clean up any spills right away. Avoid walking on wet floors. Keep items that you use a lot in easy-to-reach places. If you need to reach something above you, use a strong step stool that has a grab bar. Keep electrical cords out of the way. Do not use floor polish or wax that makes floors slippery. If you must use wax, use non-skid floor wax. Do not have throw rugs and other things on the floor that can make you trip. What can I do with my stairs? Do not leave any items on the stairs. Make sure that there are handrails on both sides of the stairs and use them. Fix handrails that are  broken or loose. Make sure that handrails are as long as the stairways. Check any carpeting to make sure that it is firmly attached to the stairs. Fix any carpet that is loose or worn. Avoid having throw rugs at the top or bottom of the stairs. If you do have throw rugs, attach them to the floor with carpet tape. Make sure that you have a light switch at the top of the stairs and the bottom of the stairs. If you do not have them, ask someone to add them for you. What else can I do to help prevent falls? Wear shoes that: Do not have high heels. Have rubber bottoms. Are comfortable and fit you well. Are closed at the toe. Do not wear sandals. If you use a stepladder: Make sure that it is fully  opened. Do not climb a closed stepladder. Make sure that both sides of the stepladder are locked into place. Ask someone to hold it for you, if possible. Clearly mark and make sure that you can see: Any grab bars or handrails. First and last steps. Where the edge of each step is. Use tools that help you move around (mobility aids) if they are needed. These include: Canes. Walkers. Scooters. Crutches. Turn on the lights when you go into a dark area. Replace any light bulbs as soon as they burn out. Set up your furniture so you have a clear path. Avoid moving your furniture around. If any of your floors are uneven, fix them. If there are any pets around you, be aware of where they are. Review your medicines with your doctor. Some medicines can make you feel dizzy. This can increase your chance of falling. Ask your doctor what other things that you can do to help prevent falls. This information is not intended to replace advice given to you by your health care provider. Make sure you discuss any questions you have with your health care provider. Document Released: 03/22/2009 Document Revised: 11/01/2015 Document Reviewed: 06/30/2014 Elsevier Interactive Patient Education  2017 [employer].

## 2021-11-14 NOTE — Progress Notes (Signed)
Virtual Visit via Telephone Note  I connected with  Mike Hall on 11/14/21 at 10:00 AM EDT by telephone and verified that I am speaking with the correct person using two identifiers.  Medicare Annual Wellness visit completed telephonically due to Covid-19 pandemic.   Persons participating in this call: This Health Coach and this patient.   Location: Patient: Home  Provider: Office    I discussed the limitations, risks, security and privacy concerns of performing an evaluation and management service by telephone and the availability of in person appointments. The patient expressed understanding and agreed to proceed.  Unable to perform video visit due to video visit attempted and failed and/or patient does not have video capability.   Some vital signs may be absent or patient reported.   Willette Brace, LPN   Subjective:   Mike Hall is a 71 y.o. male who presents for Medicare Annual/Subsequent preventive examination.  Review of Systems     Cardiac Risk Factors include: advanced age (>57mn, >>74women);dyslipidemia;male gender     Objective:    There were no vitals filed for this visit. There is no height or weight on file to calculate BMI.     11/14/2021    9:48 AM 11/08/2020    9:41 AM 12/07/2018    5:39 AM 12/06/2018   11:12 PM 04/04/2015    3:50 PM  Advanced Directives  Does Patient Have a Medical Advance Directive? No No  No Yes  Type of APersonnel officerLiving will  Does patient want to make changes to medical advance directive?     No - Patient declined  Copy of HAltoin Chart?     No - copy requested  Would patient like information on creating a medical advance directive? No - Patient declined No - Patient declined No - Patient declined      Current Medications (verified) Outpatient Encounter Medications as of 11/14/2021  Medication Sig   aspirin EC 81 MG tablet Take 81 mg by mouth daily. Swallow whole.    cholecalciferol (VITAMIN D3) 25 MCG (1000 UT) tablet Take 2,000 Units by mouth daily.    DENTA 5000 PLUS 1.1 % CREA dental cream Take by mouth.   Magnesium Gluconate (MAGONATE PO) Take 800 mg by mouth.   PRALUENT 150 MG/ML SOAJ INJECT 1 DOSE INTO THE SKIN EVERY 14 (FOURTEEN) DAYS.   rosuvastatin (CRESTOR) 5 MG tablet Take 1 tablet (5 mg total) by mouth every other day.   No facility-administered encounter medications on file as of 11/14/2021.    Allergies (verified) Patient has no known allergies.   History: Past Medical History:  Diagnosis Date   Hyperlipidemia    Pre-syncope    Past Surgical History:  Procedure Laterality Date   COLONOSCOPY  04/18/2015   [facility]ark-polyp   none     POLYPECTOMY     Family History  Problem Relation Age of Onset   Uterine cancer Mother        123  Coronary artery disease Father        age 69  Diabetes Mellitus I Daughter    Hypertension Brother    Colon cancer Neg Hx    Colon polyps Neg Hx    Esophageal cancer Neg Hx    Rectal cancer Neg Hx    Stomach cancer Neg Hx    Social History   Socioeconomic History   Marital status: Married  Spouse name: Not on file   Number of children: Not on file   Years of education: Not on file   Highest education level: Not on file  Occupational History   Not on file  Tobacco Use   Smoking status: Never   Smokeless tobacco: Never  Vaping Use   Vaping Use: Never used  Substance and Sexual Activity   Alcohol use: No   Drug use: No   Sexual activity: Not on file  Other Topics Concern   Not on file  Social History Narrative   Married. 1 daughter. No grandkids.    GED      Works in [employer] in [place]. 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       Hobbies: time at home, yardwork   Social Determinants of Health   Financial Resource Strain: Low Risk  (11/14/2021)   Overall Financial Resource Strain (CARDIA)    Difficulty of Paying Living Expenses: Not hard at all  Food Insecurity: No Food  Insecurity (11/14/2021)   Hunger Vital Sign    Worried About Running Out of Food in the Last Year: Never true    Ran Out of Food in the Last Year: Never true  Transportation Needs: No Transportation Needs (11/14/2021)   PRAPARE - Hydrologist (Medical): No    Lack of Transportation (Non-Medical): No  Physical Activity: Sufficiently Active (11/14/2021)   Exercise Vital Sign    Days of Exercise per Week: 5 days    Minutes of Exercise per Session: 40 min  Stress: No Stress Concern Present (11/14/2021)   [place]    Feeling of Stress : Not at all  Social Connections: Moderately Integrated (11/14/2021)   Social Connection and Isolation Panel [NHANES]    Frequency of Communication with Friends and Family: More than three times a week    Frequency of Social Gatherings with Friends and Family: More than three times a week    Attends Religious Services: More than 4 times per year    Active Member of [employer] or Organizations: No    Attends Music therapist: Never    Marital Status: Married    Tobacco Counseling Counseling given: Not Answered   Clinical Intake:  Pre-visit preparation completed: Yes  Pain : No/denies pain     BMI - recorded: 25.63 Nutritional Status: BMI 25 -29 Overweight Nutritional Risks: None Diabetes: No  How often do you need to have someone help you when you read instructions, pamphlets, or other written materials from your doctor or pharmacy?: 1 - Never  Diabetic?no  Interpreter Needed?: No  Information entered by :: Charlott Rakes, LPN   Activities of Daily Living    11/14/2021    9:49 AM  In your present state of health, do you have any difficulty performing the following activities:  Hearing? 0  Vision? 0  Difficulty concentrating or making decisions? 0  Walking or climbing stairs? 0  Dressing or bathing? 0  Doing errands, shopping? 0  Preparing  Food and eating ? N  Using the Toilet? N  In the past six months, have you accidently leaked urine? N  Do you have problems with loss of bowel control? N  Managing your Medications? N  Managing your Finances? N  Housekeeping or managing your Housekeeping? N    Patient Care Team: Marin Olp, MD as PCP - General (Family Medicine) Jari Pigg, MD as Consulting Physician (Dermatology) Ladene Artist,  MD as Consulting Physician (Gastroenterology)  Indicate any recent Medical Services you may have received from other than [facility] providers in the past year (date may be approximate).     Assessment:   This is a routine wellness examination for Mike Hall.  Hearing/Vision screen Hearing Screening - Comments:: Pt denies any hearing issues  Vision Screening - Comments:: Pt follows up with [facility] for annual eye exams   Dietary issues and exercise activities discussed: Current Exercise Habits: Home exercise routine, Type of exercise: walking, Time (Minutes): 40, Frequency (Times/Week): 5, Weekly Exercise (Minutes/Week): 200   Goals Addressed             This Visit's Progress    Patient Stated       Stay as healthy as can        Depression Screen    11/14/2021    9:47 AM 11/13/2021    8:10 AM 11/08/2020    9:40 AM 11/06/2020    8:10 AM 11/03/2019    8:29 AM 08/23/2018    8:38 AM 08/23/2018    8:25 AM  PHQ 2/9 Scores  PHQ - 2 Score 0 0 0 0 0 0 0  PHQ- 9 Score  0  0       Fall Risk    11/14/2021    9:48 AM 11/13/2021    8:10 AM 11/08/2020    9:42 AM 11/06/2020    8:06 AM 11/03/2019    8:29 AM  Fall Risk   Falls in the past year? 0 0 0 0 0  Number falls in past yr: 0 0 0 0 0  Injury with Fall? 0 0 0  0  Risk for fall due to : Impaired vision No Fall Risks Impaired vision  No Fall Risks  Follow up Falls prevention discussed Falls evaluation completed Falls prevention discussed  Falls evaluation completed    FALL RISK PREVENTION PERTAINING TO THE HOME:  Any stairs in or around  the home? Yes  If so, are there any without handrails? No  Home free of loose throw rugs in walkways, pet beds, electrical cords, etc? Yes  Adequate lighting in your home to reduce risk of falls? Yes   ASSISTIVE DEVICES UTILIZED TO PREVENT FALLS:  Life alert? No  Use of a cane, walker or w/c? No  Grab bars in the bathroom? No  Shower chair or bench in shower? No  Elevated toilet seat or a handicapped toilet? No   TIMED UP AND GO:  Was the test performed? No .   Cognitive Function:        11/14/2021    9:49 AM 11/08/2020    9:43 AM  6CIT Screen  What Year? 0 points 0 points  What month? 0 points 0 points  What time? 0 points 0 points  Count back from 20 0 points 0 points  Months in reverse 0 points 0 points  Repeat phrase 0 points 0 points  Total Score 0 points 0 points    Immunizations Immunization History  Administered Date(s) Administered   Influenza Split 03/03/2013   Influenza, High Dose Seasonal PF 03/19/2018   Influenza,inj,Quad PF,6+ Mos 02/28/2014, 03/08/2015, 02/26/2016, 03/17/2017   Influenza-Unspecified 03/19/2018, 03/01/2019, 04/01/2021   Moderna SARS-COV2 Booster Vaccination 05/09/2020   Moderna Sars-Covid-2 Vaccination 07/13/2019, 08/11/2019   PNEUMOCOCCAL CONJUGATE-20 11/06/2020   Zoster, Live 05/16/2013    TDAP status: Due, Education has been provided regarding the importance of this vaccine. Advised may receive this vaccine at local pharmacy or  Health Dept. Aware to provide a copy of the vaccination record if obtained from local pharmacy or Health Dept. Verbalized acceptance and understanding.  Flu Vaccine status: Up to date  Pneumococcal vaccine status: Up to date  Covid-19 vaccine status: Completed vaccines  Qualifies for Shingles Vaccine? Yes   Zostavax completed No   Shingrix Completed?: No.    Education has been provided regarding the importance of this vaccine. Patient has been advised to call insurance company to determine out of pocket  expense if they have not yet received this vaccine. Advised may also receive vaccine at local pharmacy or Health Dept. Verbalized acceptance and understanding.  Screening Tests Health Maintenance  Topic Date Due   Zoster Vaccines- Shingrix (1 of 2) Never done   TETANUS/TDAP  07/10/2018   COVID-19 Vaccine (3 - Moderna series) 11/29/2021 (Originally 07/04/2020)   INFLUENZA VACCINE  01/07/2022   COLONOSCOPY (Pts 45-63yr Insurance coverage will need to be confirmed)  10/12/2023   Pneumonia Vaccine 69 Years old  Completed   Hepatitis C Screening  Completed   HPV VACCINES  Aged Out    Health Maintenance  Health Maintenance Due  Topic Date Due   Zoster Vaccines- Shingrix (1 of 2) Never done   TETANUS/TDAP  07/10/2018    Colorectal cancer screening: Type of screening: Colonoscopy. Completed 10/11/20. Repeat every 3 years   Additional Screening:  Hepatitis C Screening: Completed 09/19/16  Vision Screening: Recommended annual ophthalmology exams for early detection of glaucoma and other disorders of the eye. Is the patient up to date with their annual eye exam?  Yes  Who is the provider or what is the name of the office in which the patient attends annual eye exams? [facility]  If pt is not established with a provider, would they like to be referred to a provider to establish care? No .   Dental Screening: Recommended annual dental exams for proper oral hygiene  Community Resource Referral / Chronic Care Management: CRR required this visit?  No   CCM required this visit?  No      Plan:     I have personally reviewed and noted the following in the patient's chart:   Medical and social history Use of alcohol, tobacco or illicit drugs  Current medications and supplements including opioid prescriptions. Patient is not currently taking opioid prescriptions. Functional ability and status Nutritional status Physical activity Advanced directives List of other  physicians Hospitalizations, surgeries, and ER visits in previous 12 months Vitals Screenings to include cognitive, depression, and falls Referrals and appointments  In addition, I have reviewed and discussed with patient certain preventive protocols, quality metrics, and best practice recommendations. A written personalized care plan for preventive services as well as general preventive health recommendations were provided to patient.     TWillette Brace LPN   [id number]  Nurse Notes: None

## 2021-12-04 ENCOUNTER — Other Ambulatory Visit: Payer: Self-pay | Admitting: Internal Medicine

## 2021-12-08 ENCOUNTER — Other Ambulatory Visit: Payer: Self-pay | Admitting: Internal Medicine

## 2021-12-09 NOTE — Telephone Encounter (Signed)
Rx(s) sent to pharmacy electronically.  

## 2021-12-18 DIAGNOSIS — M542 Cervicalgia: Secondary | ICD-10-CM | POA: Diagnosis not present

## 2021-12-27 DIAGNOSIS — M542 Cervicalgia: Secondary | ICD-10-CM | POA: Diagnosis not present

## 2021-12-31 ENCOUNTER — Other Ambulatory Visit: Payer: Self-pay | Admitting: *Deleted

## 2021-12-31 DIAGNOSIS — E785 Hyperlipidemia, unspecified: Secondary | ICD-10-CM

## 2022-01-03 DIAGNOSIS — M542 Cervicalgia: Secondary | ICD-10-CM | POA: Diagnosis not present

## 2022-01-09 DIAGNOSIS — M542 Cervicalgia: Secondary | ICD-10-CM | POA: Diagnosis not present

## 2022-01-16 DIAGNOSIS — M542 Cervicalgia: Secondary | ICD-10-CM | POA: Diagnosis not present

## 2022-01-30 DIAGNOSIS — M542 Cervicalgia: Secondary | ICD-10-CM | POA: Diagnosis not present

## 2022-03-03 ENCOUNTER — Encounter: Payer: Self-pay | Admitting: *Deleted

## 2022-03-06 ENCOUNTER — Ambulatory Visit: Payer: Medicare HMO | Admitting: Internal Medicine

## 2022-03-13 ENCOUNTER — Encounter (HOSPITAL_BASED_OUTPATIENT_CLINIC_OR_DEPARTMENT_OTHER): Payer: Self-pay | Admitting: Internal Medicine

## 2022-03-13 ENCOUNTER — Ambulatory Visit (HOSPITAL_BASED_OUTPATIENT_CLINIC_OR_DEPARTMENT_OTHER): Payer: Medicare HMO | Admitting: Internal Medicine

## 2022-03-13 VITALS — BP 118/76 | HR 65 | Ht 70.0 in | Wt 181.9 lb

## 2022-03-13 DIAGNOSIS — E785 Hyperlipidemia, unspecified: Secondary | ICD-10-CM | POA: Diagnosis not present

## 2022-03-13 DIAGNOSIS — T466X5D Adverse effect of antihyperlipidemic and antiarteriosclerotic drugs, subsequent encounter: Secondary | ICD-10-CM

## 2022-03-13 DIAGNOSIS — M791 Myalgia, unspecified site: Secondary | ICD-10-CM | POA: Diagnosis not present

## 2022-03-13 DIAGNOSIS — R931 Abnormal findings on diagnostic imaging of heart and coronary circulation: Secondary | ICD-10-CM

## 2022-03-13 NOTE — Progress Notes (Signed)
LIPID CLINIC CONSULT NOTE  Chief Complaint:  Follow-up dyslipidemia  Primary Care Physician: Marin Olp, MD  Primary Cardiologist:  None  HPI:  Mike Hall is a 71 y.o. male who is being seen today for the evaluation of dyslipidemia at the request of Marin Olp, MD.  This is a pleasant 71 year old kindly referred for management of dyslipidemia.  He has a family history of heart disease in his father who had MI at age 52.  Recent labs showed a total cholesterol 157, triglycerides 83, HDL 48 and LDL of 93.  He did undergo CT coronary calcium scoring in June 2021 showing total calcium score of 752, 87th percentile for age and sex matched control.  Calcification was noted in the LAD and circumflex distributions.  Currently he reports some dyspnea and possibly some progressive fatigue.  Is been present over the past 6 months or so.  It may be slightly worse since he realized that he had some coronary artery disease.  04/12/2020  [name] returns today for follow-up of dyslipidemia.  He has had an excellent response to his lipids.  He is currently on rosuvastatin 40 mg daily.  His total cholesterol is 157, triglycerides 107, HDL 48 and LDL of 85.  This is down from an LDL of 157 in the past.  His target is less than 70 and I do think we could possibly achieve that with additional titration of his statin.  With regards to some dyspnea he was having during his previous office visit and his high coronary calcium score, I recommended a nuclear stress test.  This was performed on February 23, 2020 and did not demonstrate any reversible ischemia.  Since then he reports his dyspnea has improved.  10/15/2020  [name] returns today for follow-up.  He says he has been doing very well on Praluent.  He says is better tolerated than the statin and he denies any other side effects he was previously having on higher dose rosuvastatin.  Repeat lipids now showed total cholesterol 149, HDL 50, LDL  84 and triglycerides 78.  Based on his high calcium score, his target LDL is less than 70.  We have not yet achieved that.  02/20/2021  [name] is seen today in follow-up.  He is doing quite well on low-dose rosuvastatin.  After adding that to his Praluent LDL is now at target.  Total cholesterol 124, HDL 49, triglycerides 81 and LDL 59.  Initially thought he had some tightness across his shoulder blades on the medicine however that resolved and he generally feels like he is tolerating the statin well.  Blood pressure is excellent today.  He denies any chest pain or worsening shortness of breath.  03/13/2022  [name] returns today for follow-up.  He continues to do very well on Praluent and low-dose rosuvastatin.  Recent lipid showed total cholesterol 122, triglycerides 81, HDL 48 and LDL 58.  He has no significant side effects with this.  He was having some shoulder and neck soreness and stopped the rosuvastatin but did not have any change in those symptoms and then resumed it.  Blood pressure is well controlled today.  He denies any chest pain or worsening shortness of breath.  He has not had a previous LP(a) assessed and I would advise that we do that measurement at least once today.  PMHx:  Past Medical History:  Diagnosis Date   Hyperlipidemia    Pre-syncope     Past Surgical  History:  Procedure Laterality Date   COLONOSCOPY  04/18/2015   Stark-polyp   none     POLYPECTOMY      FAMHx:  Family History  Problem Relation Age of Onset   Uterine cancer Mother        88   Coronary artery disease Father        age 60   Diabetes Mellitus I Daughter    Hypertension Brother    Colon cancer Neg Hx    Colon polyps Neg Hx    Esophageal cancer Neg Hx    Rectal cancer Neg Hx    Stomach cancer Neg Hx     SOCHx:   reports that he has never smoked. He has never used smokeless tobacco. He reports that he does not drink alcohol and does not use drugs.  ALLERGIES:  No Known  Allergies  ROS: Pertinent items noted in HPI and remainder of comprehensive ROS otherwise negative.  HOME MEDS: Current Outpatient Medications on File Prior to Visit  Medication Sig Dispense Refill   aspirin EC 81 MG tablet Take 81 mg by mouth daily. Swallow whole.     cholecalciferol (VITAMIN D3) 25 MCG (1000 UT) tablet Take 2,000 Units by mouth daily.      DENTA 5000 PLUS 1.1 % CREA dental cream Take by mouth.     Magnesium Gluconate (MAGONATE PO) Take 800 mg by mouth.     PRALUENT 150 MG/ML SOAJ INJECT 1 DOSE INTO THE SKIN EVERY 14 (FOURTEEN) DAYS. 6 mL 1   rosuvastatin (CRESTOR) 5 MG tablet TAKE 1 TABLET BY MOUTH EVERY OTHER DAY 45 tablet 3   No current facility-administered medications on file prior to visit.    LABS/IMAGING: No results found for this or any previous visit (from the past 48 hour(s)). No results found.  LIPID PANEL:    Component Value Date/Time   CHOL 122 11/13/2021 0854   CHOL 124 02/14/2021 0842   CHOL 189 12/20/2012 1541   TRIG 81.0 11/13/2021 0854   TRIG 278 (H) 05/16/2013 1514   TRIG 164 (H) 12/20/2012 1541   HDL 48.00 11/13/2021 0854   HDL 49 02/14/2021 0842   HDL 37 (L) 05/16/2013 1514   HDL 41 12/20/2012 1541   CHOLHDL 3 11/13/2021 0854   VLDL 16.2 11/13/2021 0854   LDLCALC 58 11/13/2021 0854   LDLCALC 59 02/14/2021 0842   LDLCALC 54 05/16/2013 1514   LDLCALC 115 (H) 12/20/2012 1541    WEIGHTS: Wt Readings from Last 3 Encounters:  03/13/22 181 lb 14.4 oz (82.5 kg)  11/13/21 178 lb 9.6 oz (81 kg)  04/15/21 182 lb 3.2 oz (82.6 kg)    VITALS: BP 118/76 (BP Location: Left Arm, Patient Position: Sitting, Cuff Size: Normal)   Pulse 65   Ht '5\' 10"'$  (1.778 m)   Wt 181 lb 14.4 oz (82.5 kg)   SpO2 99%   BMI 26.10 kg/m   EXAM: Deferred  EKG: Deferred  ASSESSMENT: Progressive dyspnea on exertion -low risk Myoview stress test without ischemia (02/2020) High CAC score 752, 87 percentile for age and sex matched control  (11/2019) Dyslipidemia goal LDL less than 70 History of possible myalgias on Lipitor Family history of heart disease in his father  PLAN: 1.   Mr. Adelsberger continues to do well on combination therapy of Praluent and rosuvastatin.  His LDL is at 58.  He does have early onset heart disease with a high calcium score.  I would like to check an LP(a)  today.  He is in agreement with that.  Although he is on adequate therapy for this, he may be a candidate for emerging therapies for LP(a).  Plan follow-up annually or sooner as necessary.  Pixie Casino, MD, [facility], [place] Director of the Advanced Lipid Disorders &  Cardiovascular Risk Reduction Clinic Diplomate of the American Board of Clinical Lipidology Attending Cardiologist  Direct Dial: [phone]  Fax: [phone]  Website:  [email or web address] 03/13/2022, 9:34 AM

## 2022-03-13 NOTE — Patient Instructions (Addendum)
Medication Instructions:  NO CHANGES  *If you need a refill on your cardiac medications before your next appointment, please call your pharmacy*   Lab Work: FASTING lab work in 1 year -- before next appointment  NMR lipoprofile, LPa  If you have labs (blood work) drawn today and your tests are completely normal, you will receive your results only by: [place] (if you have MyChart) OR A paper copy in the mail If you have any lab test that is abnormal or we need to change your treatment, we will call you to review the results.   Follow-Up: At [facility], you and your health needs are our priority.  As part of our continuing mission to provide you with exceptional heart care, we have created designated Provider Care Teams.  These Care Teams include your primary Cardiologist (physician) and Advanced Practice Providers (APPs -  Physician Assistants and Nurse Practitioners) who all work together to provide you with the care you need, when you need it.  We recommend signing up for the patient portal called "MyChart".  Sign up information is provided on this After Visit Summary.  MyChart is used to connect with patients for Virtual Visits (Telemedicine).  Patients are able to view lab/test results, encounter notes, upcoming appointments, etc.  Non-urgent messages can be sent to your provider as well.   To learn more about what you can do with MyChart, go to [email or web address].    Your next appointment:   12 month(s)  The format for your next appointment:   In Person  Provider:   Lyman Bishop MD -- lipid clinic

## 2022-03-14 LAB — LIPOPROTEIN A (LPA): Lipoprotein (a): 229.2 nmol/L — ABNORMAL HIGH (ref <75.0)

## 2022-05-18 ENCOUNTER — Other Ambulatory Visit: Payer: Self-pay | Admitting: Internal Medicine

## 2022-06-09 ENCOUNTER — Encounter (HOSPITAL_BASED_OUTPATIENT_CLINIC_OR_DEPARTMENT_OTHER): Payer: Self-pay | Admitting: Internal Medicine

## 2022-06-16 MED ORDER — REPATHA SURECLICK 140 MG/ML ~~LOC~~ SOAJ: 1.0000 mL | SUBCUTANEOUS | 3 refills | Status: DC

## 2022-06-16 NOTE — Addendum Note (Signed)
Addended by: Fidel Levy on: 06/16/2022 04:57 PM   Modules accepted: Orders

## 2022-06-17 ENCOUNTER — Telehealth: Payer: Self-pay | Admitting: Pharmacist

## 2022-06-17 MED ORDER — REPATHA SURECLICK 140 MG/ML ~~LOC~~ SOAJ: 1.0000 mL | SUBCUTANEOUS | 3 refills | Status: DC

## 2022-06-17 NOTE — Telephone Encounter (Signed)
Received fax from [facility] that PA for [place] is needed. This has been submitted, key [id number], and approved through 06/09/23. Refill sent to pharmacy with [place] on PA approval.

## 2022-06-18 DIAGNOSIS — L570 Actinic keratosis: Secondary | ICD-10-CM | POA: Diagnosis not present

## 2022-06-18 DIAGNOSIS — L578 Other skin changes due to chronic exposure to nonionizing radiation: Secondary | ICD-10-CM | POA: Diagnosis not present

## 2022-06-18 DIAGNOSIS — D2271 Melanocytic nevi of right lower limb, including hip: Secondary | ICD-10-CM | POA: Diagnosis not present

## 2022-06-18 DIAGNOSIS — L821 Other seborrheic keratosis: Secondary | ICD-10-CM | POA: Diagnosis not present

## 2022-06-18 DIAGNOSIS — Z86018 Personal history of other benign neoplasm: Secondary | ICD-10-CM | POA: Diagnosis not present

## 2022-06-18 DIAGNOSIS — D225 Melanocytic nevi of trunk: Secondary | ICD-10-CM | POA: Diagnosis not present

## 2022-06-27 ENCOUNTER — Encounter: Payer: Self-pay | Admitting: *Deleted

## 2022-06-27 ENCOUNTER — Telehealth: Payer: Self-pay | Admitting: *Deleted

## 2022-06-27 NOTE — Patient Instructions (Signed)
Visit Information  Thank you for taking time to visit with me today. Please don't hesitate to contact me if I can be of assistance to you.   Following are the goals we discussed today:   Goals Addressed             This Visit's Progress    COMPLETED: care coordination activity       Care Coordination Interventions: Reviewed medications with patient and discussed adherence with no needed refills Reviewed scheduled/upcoming provider appointments including sufficient transportation source Assessed social determinant of health barriers Educated on care management services with no immediately needs presented at this time.          Please call the care guide team at [phone] if you need to cancel or reschedule your appointment.   If you are experiencing a Mental Health or [place] or need someone to talk to, please call the Suicide and Crisis Lifeline: 988  Patient verbalizes understanding of instructions and care plan provided today and agrees to view in [place]. Active MyChart status and patient understanding of how to access instructions and care plan via MyChart confirmed with patient.     No further follow up required: No needs   Raina Mina, RN Care Management Coordinator [place] Office [phone]

## 2022-06-27 NOTE — Patient Outreach (Signed)
  Care Coordination   Initial Visit Note   06/27/2022 Name: LEANDRE WIEN MRN: [id number] DOB: 07-25-1950  Rowland Lathe is a 72 y.o. year old male who sees Yong Channel, Brayton Mars, MD for primary care. I  spoke with DPR spouse Inez Catalina today.  What matters to the patients health and wellness today?  No needs    Goals Addressed             This Visit's Progress    COMPLETED: care coordination activity       Care Coordination Interventions: Reviewed medications with patient and discussed adherence with no needed refills Reviewed scheduled/upcoming provider appointments including sufficient transportation source Assessed social determinant of health barriers Educated on care management services with no immediately needs presented at this time.         SDOH assessments and interventions completed:  Yes  SDOH Interventions Today    Flowsheet Row Most Recent Value  SDOH Interventions   Food Insecurity Interventions Intervention Not Indicated  Housing Interventions Intervention Not Indicated  Transportation Interventions Intervention Not Indicated  Utilities Interventions Intervention Not Indicated        Care Coordination Interventions:  Yes, provided   Follow up plan: No further intervention required.   Encounter Outcome:  Pt. Visit Completed   Raina Mina, RN Care Management Coordinator [place] Office [phone]

## 2022-09-03 ENCOUNTER — Encounter: Payer: Self-pay | Admitting: Family Medicine

## 2022-09-03 ENCOUNTER — Ambulatory Visit (INDEPENDENT_AMBULATORY_CARE_PROVIDER_SITE_OTHER): Payer: Medicare HMO | Admitting: Family Medicine

## 2022-09-03 VITALS — BP 118/80 | HR 61 | Temp 98.0°F | Ht 70.0 in | Wt 184.1 lb

## 2022-09-03 DIAGNOSIS — R0981 Nasal congestion: Secondary | ICD-10-CM | POA: Diagnosis not present

## 2022-09-03 DIAGNOSIS — R059 Cough, unspecified: Secondary | ICD-10-CM

## 2022-09-03 DIAGNOSIS — J069 Acute upper respiratory infection, unspecified: Secondary | ICD-10-CM | POA: Diagnosis not present

## 2022-09-03 DIAGNOSIS — J029 Acute pharyngitis, unspecified: Secondary | ICD-10-CM | POA: Diagnosis not present

## 2022-09-03 LAB — POCT RAPID STREP A (OFFICE): Rapid Strep A Screen: NEGATIVE

## 2022-09-03 LAB — POCT INFLUENZA A/B
Influenza A, POC: NEGATIVE
Influenza B, POC: NEGATIVE

## 2022-09-03 MED ORDER — AZELASTINE HCL 0.1 % NA SOLN: 2.0000 | Freq: Two times a day (BID) | NASAL | 12 refills | Status: DC

## 2022-09-03 MED ORDER — DOXYCYCLINE HYCLATE 100 MG PO TABS: 100.0000 mg | ORAL_TABLET | Freq: Two times a day (BID) | ORAL | 0 refills | Status: DC

## 2022-09-03 NOTE — Progress Notes (Signed)
Subjective:     Patient ID: Mike Hall, male    DOB: [birth year], 72 y.o.   MRN: [id number]  Chief Complaint  Patient presents with   Cough    Sx started Monday afternoon Taking Nyquil Negative Covid test this morning    Sore Throat   Nasal Congestion   Headache    HPI Since 3/25-sore throat, cough, congestion, HA(mild).  Covid neg this am.  Worse as day progressed No f/c/sob/n/v.  Similar to prev and told "allergies". Taking nyquil.   There are no preventive care reminders to display for this patient.  Past Medical History:  Diagnosis Date   Hyperlipidemia    Pre-syncope     Past Surgical History:  Procedure Laterality Date   COLONOSCOPY  04/18/2015   Stark-polyp   none     POLYPECTOMY      Outpatient Medications Prior to Visit  Medication Sig Dispense Refill   aspirin EC 81 MG tablet Take 81 mg by mouth daily. Swallow whole.     Cholecalciferol 50 MCG (2000 UT) CAPS 1 capsule every day by oral route.     DENTA 5000 PLUS 1.1 % CREA dental cream Take by mouth.     Evolocumab (REPATHA SURECLICK) [id number] MG/ML SOAJ Inject 140 mg into the skin every 14 (fourteen) days. 6 mL 3   Magnesium Gluconate (MAGONATE PO) Take 800 mg by mouth.     rosuvastatin (CRESTOR) 5 MG tablet TAKE 1 TABLET BY MOUTH EVERY OTHER DAY 45 tablet 3   cholecalciferol (VITAMIN D3) 25 MCG (1000 UT) tablet Take 2,000 Units by mouth daily.      No facility-administered medications prior to visit.    No Known Allergies ROS neg/noncontributory except as noted HPI/below      Objective:     BP 118/80   Pulse 61   Temp 98 F (36.7 C) (Temporal)   Ht 5\' 10"  (1.778 m)   Wt 184 lb 2 oz (83.5 kg)   SpO2 98%   BMI 26.42 kg/m  Wt Readings from Last 3 Encounters:  09/03/22 184 lb 2 oz (83.5 kg)  03/13/22 181 lb 14.4 oz (82.5 kg)  11/13/21 178 lb 9.6 oz (81 kg)    Physical Exam   Gen: WDWN NAD HEENT: NCAT, conjunctiva not injected, sclera nonicteric TM WNL B, OP moist, no exudates  NECK:   supple, no thyromegaly, no nodes, no carotid bruits CARDIAC: RRR, S1S2+, no murmur.  LUNGS: CTAB. No wheezes EXT:  no edema MSK: no gross abnormalities.  NEURO: A&O x3.  CN II-XII intact.  PSYCH: normal mood. Good eye contact  Results for orders placed or performed in visit on 09/03/22  POCT Influenza A/B  Result Value Ref Range   Influenza A, POC Negative Negative   Influenza B, POC Negative Negative  POCT rapid strep A  Result Value Ref Range   Rapid Strep A Screen Negative Negative        Assessment & Plan:   Problem List Items Addressed This Visit   None Visit Diagnoses     Sore throat    -  Primary   Relevant Orders   POCT Influenza A/B (Completed)   POCT rapid strep A (Completed)   Cough, unspecified type       Relevant Orders   POCT Influenza A/B (Completed)   POCT rapid strep A (Completed)   Nasal congestion       Relevant Orders   POCT Influenza A/B (Completed)   POCT rapid  strep A (Completed)   Viral upper respiratory tract infection          URI vs allergies-astelin.  Zyrtec, delsym, etc.  Doxycycline to hold.    Meds ordered this encounter  Medications   azelastine (ASTELIN) 0.1 % nasal spray    Sig: Place 2 sprays into both nostrils 2 (two) times daily.    Dispense:  30 mL    Refill:  12   doxycycline (VIBRA-TABS) 100 MG tablet    Sig: Take 1 tablet (100 mg total) by mouth 2 (two) times daily.    Dispense:  20 tablet    Refill:  0    Wellington Hampshire, MD

## 2022-09-03 NOTE — Patient Instructions (Addendum)
Claritin, zyrtec  Sent astelin nose spray.  Delsym-cough syrup,  nyquil, dayquil  Sent the doxycycline pills(antibiotics)if gets really bad

## 2022-09-09 ENCOUNTER — Encounter: Payer: Self-pay | Admitting: Family Medicine

## 2022-10-08 ENCOUNTER — Telehealth: Payer: Self-pay | Admitting: Internal Medicine

## 2022-10-08 DIAGNOSIS — E785 Hyperlipidemia, unspecified: Secondary | ICD-10-CM

## 2022-10-08 NOTE — Telephone Encounter (Signed)
Pt called in to have updated lab orders before his appt in Oct.

## 2022-10-08 NOTE — Telephone Encounter (Signed)
NMR lipoprofile was previously ordered as "future" to be released closer to appointment date. This has been released and lab slip mailed to patient.

## 2022-10-15 DIAGNOSIS — Z833 Family history of diabetes mellitus: Secondary | ICD-10-CM | POA: Diagnosis not present

## 2022-10-15 DIAGNOSIS — Z818 Family history of other mental and behavioral disorders: Secondary | ICD-10-CM | POA: Diagnosis not present

## 2022-10-15 DIAGNOSIS — Z801 Family history of malignant neoplasm of trachea, bronchus and lung: Secondary | ICD-10-CM | POA: Diagnosis not present

## 2022-10-15 DIAGNOSIS — Z7982 Long term (current) use of aspirin: Secondary | ICD-10-CM | POA: Diagnosis not present

## 2022-10-15 DIAGNOSIS — E785 Hyperlipidemia, unspecified: Secondary | ICD-10-CM | POA: Diagnosis not present

## 2022-10-15 DIAGNOSIS — K5900 Constipation, unspecified: Secondary | ICD-10-CM | POA: Diagnosis not present

## 2022-10-15 DIAGNOSIS — Z8249 Family history of ischemic heart disease and other diseases of the circulatory system: Secondary | ICD-10-CM | POA: Diagnosis not present

## 2022-10-15 DIAGNOSIS — N529 Male erectile dysfunction, unspecified: Secondary | ICD-10-CM | POA: Diagnosis not present

## 2022-11-17 ENCOUNTER — Ambulatory Visit (INDEPENDENT_AMBULATORY_CARE_PROVIDER_SITE_OTHER): Payer: Medicare HMO

## 2022-11-17 VITALS — Wt 184.0 lb

## 2022-11-17 DIAGNOSIS — Z0000 Encounter for general adult medical examination without abnormal findings: Secondary | ICD-10-CM | POA: Diagnosis not present

## 2022-11-17 NOTE — Progress Notes (Signed)
I connected with  Sid Falcon on 11/17/22 by a audio enabled telemedicine application and verified that I am speaking with the correct person using two identifiers.  Patient Location: Home  Provider Location: Office/Clinic  I discussed the limitations of evaluation and management by telemedicine. The patient expressed understanding and agreed to proceed.   Patient Medicare AWV questionnaire was completed by the patient on 11/14/22; I have confirmed that all information answered by patient is correct and no changes since this date.      Subjective:   Mike Hall is a 72 y.o. male who presents for Medicare Annual/Subsequent preventive examination.  Review of Systems     Cardiac Risk Factors include: advanced age (>87men, >53 women);male gender;dyslipidemia     Objective:    Today's Vitals   11/17/22 0926  Weight: 184 lb (83.5 kg)   Body mass index is 26.4 kg/m.     11/17/2022    9:44 AM 11/14/2021    9:48 AM 11/08/2020    9:41 AM 12/07/2018    5:39 AM 12/06/2018   11:12 PM 04/04/2015    3:50 PM  Advanced Directives  Does Patient Have a Medical Advance Directive? No No No  No Yes  Type of Careers adviser;Living will  Does patient want to make changes to medical advance directive?      No - Patient declined  Copy of Healthcare Power of Attorney in Chart?      No - copy requested  Would patient like information on creating a medical advance directive? No - Patient declined No - Patient declined No - Patient declined No - Patient declined      Current Medications (verified) Outpatient Encounter Medications as of 11/17/2022  Medication Sig   aspirin EC 81 MG tablet Take 81 mg by mouth daily. Swallow whole.   Cholecalciferol 50 MCG (2000 UT) CAPS 1 capsule every day by oral route.   DENTA 5000 PLUS 1.1 % CREA dental cream Take by mouth.   Evolocumab (REPATHA SURECLICK) 140 MG/ML SOAJ Inject 140 mg into the skin every 14 (fourteen) days.    Magnesium Gluconate (MAGONATE PO) Take 800 mg by mouth.   rosuvastatin (CRESTOR) 5 MG tablet TAKE 1 TABLET BY MOUTH EVERY OTHER DAY   [DISCONTINUED] azelastine (ASTELIN) 0.1 % nasal spray Place 2 sprays into both nostrils 2 (two) times daily.   [DISCONTINUED] doxycycline (VIBRA-TABS) 100 MG tablet Take 1 tablet (100 mg total) by mouth 2 (two) times daily.   No facility-administered encounter medications on file as of 11/17/2022.    Allergies (verified) Patient has no known allergies.   History: Past Medical History:  Diagnosis Date   Hyperlipidemia    Pre-syncope    Past Surgical History:  Procedure Laterality Date   COLONOSCOPY  04/18/2015   Stark-polyp   none     POLYPECTOMY     Family History  Problem Relation Age of Onset   Uterine cancer Mother        15   Coronary artery disease Father        age 63   Diabetes Mellitus I Daughter    Hypertension Brother    Colon cancer Neg Hx    Colon polyps Neg Hx    Esophageal cancer Neg Hx    Rectal cancer Neg Hx    Stomach cancer Neg Hx    Social History   Socioeconomic History   Marital status: Married  Spouse name: Not on file   Number of children: Not on file   Years of education: Not on file   Highest education level: Not on file  Occupational History   Not on file  Tobacco Use   Smoking status: Never   Smokeless tobacco: Never  Vaping Use   Vaping Use: Never used  Substance and Sexual Activity   Alcohol use: No   Drug use: No   Sexual activity: Not on file  Other Topics Concern   Not on file  Social History Narrative   Married. 1 daughter. No grandkids.    GED      Works in [employer] center in [place]. Gem Dandy, Public affairs consultant.       Hobbies: time at home, yardwork   Social Determinants of Health   Financial Resource Strain: Low Risk  (11/14/2022)   Overall Financial Resource Strain (CARDIA)    Difficulty of Paying Living Expenses: Not hard at all  Food Insecurity: No Food Insecurity  (11/14/2022)   Hunger Vital Sign    Worried About Running Out of Food in the Last Year: Never true    Ran Out of Food in the Last Year: Never true  Transportation Needs: No Transportation Needs (11/14/2022)   PRAPARE - Administrator, Civil Service (Medical): No    Lack of Transportation (Non-Medical): No  Physical Activity: Sufficiently Active (11/14/2022)   Exercise Vital Sign    Days of Exercise per Week: 5 days    Minutes of Exercise per Session: 40 min  Stress: No Stress Concern Present (11/14/2022)   [employer] of Occupational Health - Occupational Stress Questionnaire    Feeling of Stress : Not at all  Social Connections: Moderately Integrated (11/14/2022)   Social Connection and Isolation Panel [NHANES]    Frequency of Communication with Friends and Family: More than three times a week    Frequency of Social Gatherings with Friends and Family: Once a week    Attends Religious Services: More than 4 times per year    Active Member of [employer] or Organizations: No    Attends Engineer, structural: Never    Marital Status: Married    Tobacco Counseling Counseling given: Not Answered   Clinical Intake:  Pre-visit preparation completed: Yes  Pain : No/denies pain     BMI - recorded: 26.4 Nutritional Status: BMI 25 -29 Overweight Nutritional Risks: None Diabetes: No  How often do you need to have someone help you when you read instructions, pamphlets, or other written materials from your doctor or pharmacy?: 1 - Never  Diabetic?no  Interpreter Needed?: No  Information entered by :: Lanier Ensign, LPN   Activities of Daily Living    11/14/2022   12:08 PM  In your present state of health, do you have any difficulty performing the following activities:  Hearing? 0  Vision? 0  Difficulty concentrating or making decisions? 0  Walking or climbing stairs? 0  Dressing or bathing? 0  Doing errands, shopping? 0  Preparing Food and eating ? N  Using  the Toilet? N  In the past six months, have you accidently leaked urine? N  Do you have problems with loss of bowel control? N  Managing your Medications? N  Managing your Finances? N  Housekeeping or managing your Housekeeping? N    Patient Care Team: Shelva Majestic, MD as PCP - General (Family Medicine) Elmon Else, MD as Consulting Physician (Dermatology) Meryl Dare, MD as Consulting Physician (  Gastroenterology)  Indicate any recent Medical Services you may have received from other than [facility] providers in the past year (date may be approximate).     Assessment:   This is a routine wellness examination for Mike Hall.  Hearing/Vision screen Hearing Screening - Comments:: Pt denies any hearing issues  Vision Screening - Comments:: Pt follows up with [facility] for annual eye exams   Dietary issues and exercise activities discussed: Current Exercise Habits: Home exercise routine, Type of exercise: walking, Time (Minutes): 40, Frequency (Times/Week): 5, Weekly Exercise (Minutes/Week): 200   Goals Addressed             This Visit's Progress    Patient Stated       None at this time        Depression Screen    11/17/2022    9:43 AM 11/14/2021    9:47 AM 11/13/2021    8:10 AM 11/08/2020    9:40 AM 11/06/2020    8:10 AM 11/03/2019    8:29 AM 08/23/2018    8:38 AM  PHQ 2/9 Scores  PHQ - 2 Score 0 0 0 0 0 0 0  PHQ- 9 Score   0  0      Fall Risk    11/14/2022   12:08 PM 09/03/2022   12:50 PM 11/14/2021    9:48 AM 11/13/2021    8:10 AM 11/08/2020    9:42 AM  Fall Risk   Falls in the past year? 0 0 0 0 0  Number falls in past yr: 0 0 0 0 0  Injury with Fall? 0 0 0 0 0  Risk for fall due to : Impaired vision No Fall Risks Impaired vision No Fall Risks Impaired vision  Follow up Falls prevention discussed Falls evaluation completed Falls prevention discussed Falls evaluation completed Falls prevention discussed    FALL RISK PREVENTION PERTAINING TO THE HOME:  Any stairs in or  around the home? Yes  If so, are there any without handrails? No  Home free of loose throw rugs in walkways, pet beds, electrical cords, etc? Yes  Adequate lighting in your home to reduce risk of falls? Yes   ASSISTIVE DEVICES UTILIZED TO PREVENT FALLS:  Life alert? No  Use of a cane, walker or w/c? No  Grab bars in the bathroom? No  Shower chair or bench in shower? Yes  Elevated toilet seat or a handicapped toilet? Yes   TIMED UP AND GO:  Was the test performed? No .   Cognitive Function:        11/17/2022    9:45 AM 11/14/2021    9:49 AM 11/08/2020    9:43 AM  6CIT Screen  What Year? 0 points 0 points 0 points  What month? 0 points 0 points 0 points  What time? 0 points 0 points 0 points  Count back from 20 0 points 0 points 0 points  Months in reverse 0 points 0 points 0 points  Repeat phrase 0 points 0 points 0 points  Total Score 0 points 0 points 0 points    Immunizations Immunization History  Administered Date(s) Administered   Fluad Quad(high Dose 65+) 03/16/2022   Influenza Split 03/03/2013   Influenza, High Dose Seasonal PF 03/19/2018   Influenza,inj,Quad PF,6+ Mos 02/28/2014, 03/08/2015, 02/26/2016, 03/17/2017   Influenza-Unspecified 03/19/2018, 03/01/2019, 04/01/2021   Moderna SARS-COV2 Booster Vaccination 05/09/2020   Moderna Sars-Covid-2 Vaccination 07/13/2019, 08/11/2019   PNEUMOCOCCAL CONJUGATE-20 11/06/2020   Tdap 01/19/2022   Zoster  Recombinat (Shingrix) 01/19/2022, 05/06/2022   Zoster, Live 05/16/2013    TDAP status: Up to date  Flu Vaccine status: Up to date  Pneumococcal vaccine status: Up to date  Covid-19 vaccine status: Completed vaccines  Qualifies for Shingles Vaccine? Yes   Zostavax completed Yes   Shingrix Completed?: Yes  Screening Tests Health Maintenance  Topic Date Due   COVID-19 Vaccine (3 - Moderna risk series) 01/20/2023 (Originally 06/06/2020)   INFLUENZA VACCINE  01/08/2023   Colonoscopy  10/12/2023   Medicare Annual  Wellness (AWV)  11/17/2023   DTaP/Tdap/Td (2 - Td or Tdap) 01/20/2032   Pneumonia Vaccine 89+ Years old  Completed   Hepatitis C Screening  Completed   Zoster Vaccines- Shingrix  Completed   HPV VACCINES  Aged Out    Health Maintenance  There are no preventive care reminders to display for this patient.   Colorectal cancer screening: Type of screening: Colonoscopy. Completed 10/11/20. Repeat every 3 years   Additional Screening:  Hepatitis C Screening: Completed 09/19/16  Vision Screening: Recommended annual ophthalmology exams for early detection of glaucoma and other disorders of the eye. Is the patient up to date with their annual eye exam?  Yes  Who is the provider or what is the name of the office in which the patient attends annual eye exams? [facility]  If pt is not established with a provider, would they like to be referred to a provider to establish care? No .   Dental Screening: Recommended annual dental exams for proper oral hygiene  Community Resource Referral / Chronic Care Management: CRR required this visit?  No   CCM required this visit?  No      Plan:     I have personally reviewed and noted the following in the patient's chart:   Medical and social history Use of alcohol, tobacco or illicit drugs  Current medications and supplements including opioid prescriptions. Patient is not currently taking opioid prescriptions. Functional ability and status Nutritional status Physical activity Advanced directives List of other physicians Hospitalizations, surgeries, and ER visits in previous 12 months Vitals Screenings to include cognitive, depression, and falls Referrals and appointments  In addition, I have reviewed and discussed with patient certain preventive protocols, quality metrics, and best practice recommendations. A written personalized care plan for preventive services as well as general preventive health recommendations were provided to patient.      Marzella Schlein, LPN   1/32/4401   Nurse Notes: none

## 2022-11-17 NOTE — Patient Instructions (Signed)
Mike Hall , Thank you for taking time to come for your Medicare Wellness Visit. I appreciate your ongoing commitment to your health goals. Please review the following plan we discussed and let me know if I can assist you in the future.   These are the goals we discussed:  Goals      Patient Stated     Stay healthy and live long      Patient Stated     Stay as healthy as can         This is a list of the screening recommended for you and due dates:  Health Maintenance  Topic Date Due   Medicare Annual Wellness Visit  11/15/2022   COVID-19 Vaccine (3 - Moderna risk series) 01/20/2023*   Flu Shot  01/08/2023   Colon Cancer Screening  10/12/2023   DTaP/Tdap/Td vaccine (2 - Td or Tdap) 01/20/2032   Pneumonia Vaccine  Completed   Hepatitis C Screening  Completed   Zoster (Shingles) Vaccine  Completed   HPV Vaccine  Aged Out  *Topic was postponed. The date shown is not the original due date.    Advanced directives: Advance directive discussed with you today. Even though you declined this today please call our office should you change your mind and we can give you the proper paperwork for you to fill out.  Conditions/risks identified: none at this time   Next appointment: Follow up in one year for your annual wellness visit.   Preventive Care 58 Years and Older, Male  Preventive care refers to lifestyle choices and visits with your health care provider that can promote health and wellness. What does preventive care include? A yearly physical exam. This is also called an annual well check. Dental exams once or twice a year. Routine eye exams. Ask your health care provider how often you should have your eyes checked. Personal lifestyle choices, including: Daily care of your teeth and gums. Regular physical activity. Eating a healthy diet. Avoiding tobacco and drug use. Limiting alcohol use. Practicing safe sex. Taking low doses of aspirin every day. Taking vitamin and  mineral supplements as recommended by your health care provider. What happens during an annual well check? The services and screenings done by your health care provider during your annual well check will depend on your age, overall health, lifestyle risk factors, and family history of disease. Counseling  Your health care provider may ask you questions about your: Alcohol use. Tobacco use. Drug use. Emotional well-being. Home and relationship well-being. Sexual activity. Eating habits. History of falls. Memory and ability to understand (cognition). Work and work Astronomer. Screening  You may have the following tests or measurements: Height, weight, and BMI. Blood pressure. Lipid and cholesterol levels. These may be checked every 5 years, or more frequently if you are over 59 years old. Skin check. Lung cancer screening. You may have this screening every year starting at age 29 if you have a 30-pack-year history of smoking and currently smoke or have quit within the past 15 years. Fecal occult blood test (FOBT) of the stool. You may have this test every year starting at age 5. Flexible sigmoidoscopy or colonoscopy. You may have a sigmoidoscopy every 5 years or a colonoscopy every 10 years starting at age 46. Prostate cancer screening. Recommendations will vary depending on your family history and other risks. Hepatitis C blood test. Hepatitis B blood test. Sexually transmitted disease (STD) testing. Diabetes screening. This is done by checking your blood sugar (  glucose) after you have not eaten for a while (fasting). You may have this done every 1-3 years. Abdominal aortic aneurysm (AAA) screening. You may need this if you are a current or former smoker. Osteoporosis. You may be screened starting at age 59 if you are at high risk. Talk with your health care provider about your test results, treatment options, and if necessary, the need for more tests. Vaccines  Your health care  provider may recommend certain vaccines, such as: Influenza vaccine. This is recommended every year. Tetanus, diphtheria, and acellular pertussis (Tdap, Td) vaccine. You may need a Td booster every 10 years. Zoster vaccine. You may need this after age 75. Pneumococcal 13-valent conjugate (PCV13) vaccine. One dose is recommended after age 21. Pneumococcal polysaccharide (PPSV23) vaccine. One dose is recommended after age 23. Talk to your health care provider about which screenings and vaccines you need and how often you need them. This information is not intended to replace advice given to you by your health care provider. Make sure you discuss any questions you have with your health care provider. Document Released: 06/22/2015 Document Revised: 02/13/2016 Document Reviewed: 03/27/2015 Elsevier Interactive Patient Education  2017 [employer] Prevention in the Home Falls can cause injuries. They can happen to people of all ages. There are many things you can do to make your home safe and to help prevent falls. What can I do on the outside of my home? Regularly fix the edges of walkways and driveways and fix any cracks. Remove anything that might make you trip as you walk through a door, such as a raised step or threshold. Trim any bushes or trees on the path to your home. Use bright outdoor lighting. Clear any walking paths of anything that might make someone trip, such as rocks or tools. Regularly check to see if handrails are loose or broken. Make sure that both sides of any steps have handrails. Any raised decks and porches should have guardrails on the edges. Have any leaves, snow, or ice cleared regularly. Use sand or salt on walking paths during winter. Clean up any spills in your garage right away. This includes oil or grease spills. What can I do in the bathroom? Use night lights. Install grab bars by the toilet and in the tub and shower. Do not use towel bars as grab  bars. Use non-skid mats or decals in the tub or shower. If you need to sit down in the shower, use a plastic, non-slip stool. Keep the floor dry. Clean up any water that spills on the floor as soon as it happens. Remove soap buildup in the tub or shower regularly. Attach bath mats securely with double-sided non-slip rug tape. Do not have throw rugs and other things on the floor that can make you trip. What can I do in the bedroom? Use night lights. Make sure that you have a light by your bed that is easy to reach. Do not use any sheets or blankets that are too big for your bed. They should not hang down onto the floor. Have a firm chair that has side arms. You can use this for support while you get dressed. Do not have throw rugs and other things on the floor that can make you trip. What can I do in the kitchen? Clean up any spills right away. Avoid walking on wet floors. Keep items that you use a lot in easy-to-reach places. If you need to reach something above you, use  a strong step stool that has a grab bar. Keep electrical cords out of the way. Do not use floor polish or wax that makes floors slippery. If you must use wax, use non-skid floor wax. Do not have throw rugs and other things on the floor that can make you trip. What can I do with my stairs? Do not leave any items on the stairs. Make sure that there are handrails on both sides of the stairs and use them. Fix handrails that are broken or loose. Make sure that handrails are as long as the stairways. Check any carpeting to make sure that it is firmly attached to the stairs. Fix any carpet that is loose or worn. Avoid having throw rugs at the top or bottom of the stairs. If you do have throw rugs, attach them to the floor with carpet tape. Make sure that you have a light switch at the top of the stairs and the bottom of the stairs. If you do not have them, ask someone to add them for you. What else can I do to help prevent  falls? Wear shoes that: Do not have high heels. Have rubber bottoms. Are comfortable and fit you well. Are closed at the toe. Do not wear sandals. If you use a stepladder: Make sure that it is fully opened. Do not climb a closed stepladder. Make sure that both sides of the stepladder are locked into place. Ask someone to hold it for you, if possible. Clearly mark and make sure that you can see: Any grab bars or handrails. First and last steps. Where the edge of each step is. Use tools that help you move around (mobility aids) if they are needed. These include: Canes. Walkers. Scooters. Crutches. Turn on the lights when you go into a dark area. Replace any light bulbs as soon as they burn out. Set up your furniture so you have a clear path. Avoid moving your furniture around. If any of your floors are uneven, fix them. If there are any pets around you, be aware of where they are. Review your medicines with your doctor. Some medicines can make you feel dizzy. This can increase your chance of falling. Ask your doctor what other things that you can do to help prevent falls. This information is not intended to replace advice given to you by your health care provider. Make sure you discuss any questions you have with your health care provider. Document Released: 03/22/2009 Document Revised: 11/01/2015 Document Reviewed: 06/30/2014 Elsevier Interactive Patient Education  2017 [employer].

## 2022-12-15 ENCOUNTER — Encounter: Payer: Medicare HMO | Admitting: Family Medicine

## 2022-12-25 ENCOUNTER — Other Ambulatory Visit: Payer: Self-pay | Admitting: Internal Medicine

## 2022-12-25 ENCOUNTER — Encounter (HOSPITAL_BASED_OUTPATIENT_CLINIC_OR_DEPARTMENT_OTHER): Payer: Self-pay | Admitting: Internal Medicine

## 2023-01-07 ENCOUNTER — Encounter (INDEPENDENT_AMBULATORY_CARE_PROVIDER_SITE_OTHER): Payer: Self-pay

## 2023-01-13 ENCOUNTER — Other Ambulatory Visit: Payer: Self-pay | Admitting: Internal Medicine

## 2023-01-13 DIAGNOSIS — E7841 Elevated Lipoprotein(a): Secondary | ICD-10-CM

## 2023-01-13 DIAGNOSIS — E785 Hyperlipidemia, unspecified: Secondary | ICD-10-CM

## 2023-01-16 ENCOUNTER — Other Ambulatory Visit: Payer: Self-pay | Admitting: Internal Medicine

## 2023-02-04 ENCOUNTER — Encounter: Payer: Medicare HMO | Admitting: Family Medicine

## 2023-02-23 ENCOUNTER — Encounter (HOSPITAL_BASED_OUTPATIENT_CLINIC_OR_DEPARTMENT_OTHER): Payer: Self-pay | Admitting: Internal Medicine

## 2023-03-11 ENCOUNTER — Ambulatory Visit: Payer: Medicare HMO | Admitting: Internal Medicine

## 2023-03-18 LAB — NMR, LIPOPROFILE
Cholesterol, Total: 105 mg/dL (ref 100–199)
HDL Particle Number: 31.7 umol/L (ref >=30.5)
HDL-C: 46 mg/dL (ref >39)
LDL Particle Number: 504 nmol/L (ref <1000)
LDL Size: 20.8 nmol (ref >20.5)
LDL-C (NIH Calc): 44 mg/dL (ref 0–99)
LP-IR Score: 50 — ABNORMAL HIGH (ref <=45)
Small LDL Particle Number: 179 nmol/L (ref <=527)
Triglycerides: 69 mg/dL (ref 0–149)

## 2023-03-18 LAB — LIPOPROTEIN A (LPA): Lipoprotein (a): 147.9 nmol/L — ABNORMAL HIGH (ref <75.0)

## 2023-03-23 ENCOUNTER — Encounter (HOSPITAL_BASED_OUTPATIENT_CLINIC_OR_DEPARTMENT_OTHER): Payer: Self-pay | Admitting: Internal Medicine

## 2023-03-23 ENCOUNTER — Ambulatory Visit (HOSPITAL_BASED_OUTPATIENT_CLINIC_OR_DEPARTMENT_OTHER): Payer: Medicare HMO | Admitting: Internal Medicine

## 2023-03-23 VITALS — BP 108/70 | HR 64 | Ht 70.5 in | Wt 181.0 lb

## 2023-03-23 DIAGNOSIS — M791 Myalgia, unspecified site: Secondary | ICD-10-CM

## 2023-03-23 DIAGNOSIS — E7841 Elevated Lipoprotein(a): Secondary | ICD-10-CM

## 2023-03-23 DIAGNOSIS — R931 Abnormal findings on diagnostic imaging of heart and coronary circulation: Secondary | ICD-10-CM

## 2023-03-23 DIAGNOSIS — T466X5D Adverse effect of antihyperlipidemic and antiarteriosclerotic drugs, subsequent encounter: Secondary | ICD-10-CM

## 2023-03-23 DIAGNOSIS — E785 Hyperlipidemia, unspecified: Secondary | ICD-10-CM

## 2023-03-23 NOTE — Progress Notes (Signed)
LIPID CLINIC CONSULT NOTE  Chief Complaint:  Follow-up dyslipidemia  Primary Care Physician: Shelva Majestic, MD  Primary Cardiologist:  None  HPI:  Mike Hall is a 72 y.o. male who is being seen today for the evaluation of dyslipidemia at the request of Shelva Majestic, MD.  This is a pleasant 72 year old kindly referred for management of dyslipidemia.  He has a family history of heart disease in his father who had MI at age 40.  Recent labs showed a total cholesterol 157, triglycerides 83, HDL 48 and LDL of 93.  He did undergo CT coronary calcium scoring in June 2021 showing total calcium score of 752, 87th percentile for age and sex matched control.  Calcification was noted in the LAD and circumflex distributions.  Currently he reports some dyspnea and possibly some progressive fatigue.  Is been present over the past 6 months or so.  It may be slightly worse since he realized that he had some coronary artery disease.  04/12/2020  [name] returns today for follow-up of dyslipidemia.  He has had an excellent response to his lipids.  He is currently on rosuvastatin 40 mg daily.  His total cholesterol is 157, triglycerides 107, HDL 48 and LDL of 85.  This is down from an LDL of 157 in the past.  His target is less than 70 and I do think we could possibly achieve that with additional titration of his statin.  With regards to some dyspnea he was having during his previous office visit and his high coronary calcium score, I recommended a nuclear stress test.  This was performed on February 23, 2020 and did not demonstrate any reversible ischemia.  Since then he reports his dyspnea has improved.  10/15/2020  [name] returns today for follow-up.  He says he has been doing very well on Praluent.  He says is better tolerated than the statin and he denies any other side effects he was previously having on higher dose rosuvastatin.  Repeat lipids now showed total cholesterol 149, HDL 50, LDL  84 and triglycerides 78.  Based on his high calcium score, his target LDL is less than 70.  We have not yet achieved that.  02/20/2021  [name] is seen today in follow-up.  He is doing quite well on low-dose rosuvastatin.  After adding that to his Praluent LDL is now at target.  Total cholesterol 124, HDL 49, triglycerides 81 and LDL 59.  Initially thought he had some tightness across his shoulder blades on the medicine however that resolved and he generally feels like he is tolerating the statin well.  Blood pressure is excellent today.  He denies any chest pain or worsening shortness of breath.  03/13/2022  [name] returns today for follow-up.  He continues to do very well on Praluent and low-dose rosuvastatin.  Recent lipid showed total cholesterol 122, triglycerides 81, HDL 48 and LDL 58.  He has no significant side effects with this.  He was having some shoulder and neck soreness and stopped the rosuvastatin but did not have any change in those symptoms and then resumed it.  Blood pressure is well controlled today.  He denies any chest pain or worsening shortness of breath.  He has not had a previous LP(a) assessed and I would advise that we do that measurement at least once today.  03/23/2023  [name] returns today for follow-up.  He continues to feel very well.  He has had an excellent  improvement in his lipids.  His LDL particle number is now 504, LDL of 44, HDL is 46 and triglycerides 69.  His LP(a) is come down substantially to 147 (down from 229).  Overall he feels well.  PMHx:  Past Medical History:  Diagnosis Date   Hyperlipidemia    Pre-syncope     Past Surgical History:  Procedure Laterality Date   COLONOSCOPY  04/18/2015   Stark-polyp   none     POLYPECTOMY      FAMHx:  Family History  Problem Relation Age of Onset   Uterine cancer Mother        41   Coronary artery disease Father        age 84   Diabetes Mellitus I Daughter    Hypertension Brother     Colon cancer Neg Hx    Colon polyps Neg Hx    Esophageal cancer Neg Hx    Rectal cancer Neg Hx    Stomach cancer Neg Hx     SOCHx:   reports that he has never smoked. He has never used smokeless tobacco. He reports that he does not drink alcohol and does not use drugs.  ALLERGIES:  No Known Allergies  ROS: Pertinent items noted in HPI and remainder of comprehensive ROS otherwise negative.  HOME MEDS: Current Outpatient Medications on File Prior to Visit  Medication Sig Dispense Refill   aspirin EC 81 MG tablet Take 81 mg by mouth daily. Swallow whole.     Cholecalciferol 50 MCG (2000 UT) CAPS 1 capsule every day by oral route.     DENTA 5000 PLUS 1.1 % CREA dental cream Take by mouth.     Evolocumab (REPATHA SURECLICK) 140 MG/ML SOAJ Inject 140 mg into the skin every 14 (fourteen) days. 6 mL 3   Magnesium Gluconate (MAGONATE PO) Take 800 mg by mouth.     rosuvastatin (CRESTOR) 5 MG tablet TAKE 1 TABLET BY MOUTH EVERY OTHER DAY 45 tablet 0   No current facility-administered medications on file prior to visit.    LABS/IMAGING: No results found for this or any previous visit (from the past 48 hour(s)). No results found.  LIPID PANEL:    Component Value Date/Time   CHOL 122 11/13/2021 0854   CHOL 124 02/14/2021 0842   CHOL 189 12/20/2012 1541   TRIG 81.0 11/13/2021 0854   TRIG 278 (H) 05/16/2013 1514   TRIG 164 (H) 12/20/2012 1541   HDL 48.00 11/13/2021 0854   HDL 49 02/14/2021 0842   HDL 37 (L) 05/16/2013 1514   HDL 41 12/20/2012 1541   CHOLHDL 3 11/13/2021 0854   VLDL 16.2 11/13/2021 0854   LDLCALC 58 11/13/2021 0854   LDLCALC 59 02/14/2021 0842   LDLCALC 54 05/16/2013 1514   LDLCALC 115 (H) 12/20/2012 1541    WEIGHTS: Wt Readings from Last 3 Encounters:  03/23/23 181 lb (82.1 kg)  11/17/22 184 lb (83.5 kg)  09/03/22 184 lb 2 oz (83.5 kg)    VITALS: BP 108/70 (BP Location: Left Arm, Patient Position: Sitting, Cuff Size: Normal)   Pulse 64   Ht 5' 10.5"  (1.791 m)   Wt 181 lb (82.1 kg)   BMI 25.60 kg/m   EXAM: Deferred  EKG: Deferred  ASSESSMENT: Progressive dyspnea on exertion -low risk Myoview stress test without ischemia (02/2020) Elevated LP(a) of 229 nmol/L -> improved 247 nmol/L High CAC score 752, 87 percentile for age and sex matched control (11/2019) Dyslipidemia goal LDL less than 70  History of possible myalgias on Lipitor Family history of heart disease in his father  PLAN: 1.   Mr. Stage has had a substantial reduction in his cholesterol which is now at target across-the-board.  Will continue his current therapies.  He is also had significant lowering of LP(a).  He is tolerating the medicine well.  Plan follow-up with me annually or sooner as necessary.  Chrystie Nose, MD, [facility], FACP  [place]  [facility]  Medical Director of the Advanced Lipid Disorders &  Cardiovascular Risk Reduction Clinic Diplomate of the American Board of Clinical Lipidology Attending Cardiologist  Direct Dial: [phone]  Fax: [phone]  Website:  [email or web address] 03/23/2023, 9:31 AM

## 2023-03-23 NOTE — Patient Instructions (Signed)
Medication Instructions:  NO CHANGES  *If you need a refill on your cardiac medications before your next appointment, please call your pharmacy*   Lab Work: FASTING lab work to check cholesterol in 1 year -- before next visit  If you have labs (blood work) drawn today and your tests are completely normal, you will receive your results only by: MyChart Message (if you have MyChart) OR A paper copy in the mail If you have any lab test that is abnormal or we need to change your treatment, we will call you to review the results.    Follow-Up: At [facility], you and your health needs are our priority.  As part of our continuing mission to provide you with exceptional heart care, we have created designated Provider Care Teams.  These Care Teams include your primary Cardiologist (physician) and Advanced Practice Providers (APPs -  Physician Assistants and Nurse Practitioners) who all work together to provide you with the care you need, when you need it.  We recommend signing up for the patient portal called "MyChart".  Sign up information is provided on this After Visit Summary.  MyChart is used to connect with patients for Virtual Visits (Telemedicine).  Patients are able to view lab/test results, encounter notes, upcoming appointments, etc.  Non-urgent messages can be sent to your provider as well.   To learn more about what you can do with MyChart, go to [email or web address].    Your next appointment:    12 months with Dr. Rennis Golden

## 2023-03-27 ENCOUNTER — Other Ambulatory Visit: Payer: Self-pay | Admitting: Internal Medicine

## 2023-06-26 DIAGNOSIS — D2271 Melanocytic nevi of right lower limb, including hip: Secondary | ICD-10-CM | POA: Diagnosis not present

## 2023-06-26 DIAGNOSIS — Z86018 Personal history of other benign neoplasm: Secondary | ICD-10-CM | POA: Diagnosis not present

## 2023-06-26 DIAGNOSIS — C44219 Basal cell carcinoma of skin of left ear and external auricular canal: Secondary | ICD-10-CM | POA: Diagnosis not present

## 2023-06-26 DIAGNOSIS — L821 Other seborrheic keratosis: Secondary | ICD-10-CM | POA: Diagnosis not present

## 2023-06-26 DIAGNOSIS — D225 Melanocytic nevi of trunk: Secondary | ICD-10-CM | POA: Diagnosis not present

## 2023-06-26 DIAGNOSIS — L570 Actinic keratosis: Secondary | ICD-10-CM | POA: Diagnosis not present

## 2023-06-26 DIAGNOSIS — D485 Neoplasm of uncertain behavior of skin: Secondary | ICD-10-CM | POA: Diagnosis not present

## 2023-06-26 DIAGNOSIS — L578 Other skin changes due to chronic exposure to nonionizing radiation: Secondary | ICD-10-CM | POA: Diagnosis not present

## 2023-07-24 ENCOUNTER — Other Ambulatory Visit: Payer: Self-pay | Admitting: Internal Medicine

## 2023-07-24 DIAGNOSIS — R931 Abnormal findings on diagnostic imaging of heart and coronary circulation: Secondary | ICD-10-CM

## 2023-07-24 DIAGNOSIS — E7841 Elevated Lipoprotein(a): Secondary | ICD-10-CM

## 2023-07-24 DIAGNOSIS — E785 Hyperlipidemia, unspecified: Secondary | ICD-10-CM

## 2023-07-29 DIAGNOSIS — C44219 Basal cell carcinoma of skin of left ear and external auricular canal: Secondary | ICD-10-CM | POA: Diagnosis not present

## 2023-08-13 ENCOUNTER — Encounter: Payer: Medicare HMO | Admitting: Family Medicine

## 2023-08-20 DIAGNOSIS — L578 Other skin changes due to chronic exposure to nonionizing radiation: Secondary | ICD-10-CM | POA: Diagnosis not present

## 2023-08-20 DIAGNOSIS — Z5189 Encounter for other specified aftercare: Secondary | ICD-10-CM | POA: Diagnosis not present

## 2023-08-21 ENCOUNTER — Ambulatory Visit (INDEPENDENT_AMBULATORY_CARE_PROVIDER_SITE_OTHER): Payer: Medicare HMO | Admitting: Family Medicine

## 2023-08-21 ENCOUNTER — Encounter: Payer: Self-pay | Admitting: Family Medicine

## 2023-08-21 VITALS — BP 120/80 | HR 52 | Temp 97.1°F | Ht 70.5 in | Wt 181.1 lb

## 2023-08-21 DIAGNOSIS — I2510 Atherosclerotic heart disease of native coronary artery without angina pectoris: Secondary | ICD-10-CM

## 2023-08-21 DIAGNOSIS — E559 Vitamin D deficiency, unspecified: Secondary | ICD-10-CM

## 2023-08-21 DIAGNOSIS — E663 Overweight: Secondary | ICD-10-CM | POA: Diagnosis not present

## 2023-08-21 DIAGNOSIS — G720 Drug-induced myopathy: Secondary | ICD-10-CM

## 2023-08-21 DIAGNOSIS — Z0000 Encounter for general adult medical examination without abnormal findings: Secondary | ICD-10-CM

## 2023-08-21 DIAGNOSIS — Z131 Encounter for screening for diabetes mellitus: Secondary | ICD-10-CM | POA: Diagnosis not present

## 2023-08-21 DIAGNOSIS — Z125 Encounter for screening for malignant neoplasm of prostate: Secondary | ICD-10-CM

## 2023-08-21 DIAGNOSIS — E785 Hyperlipidemia, unspecified: Secondary | ICD-10-CM | POA: Diagnosis not present

## 2023-08-21 DIAGNOSIS — Z860101 Personal history of adenomatous and serrated colon polyps: Secondary | ICD-10-CM | POA: Diagnosis not present

## 2023-08-21 LAB — COMPREHENSIVE METABOLIC PANEL
ALT: 20 U/L (ref 0–53)
AST: 26 U/L (ref 0–37)
Albumin: 4.7 g/dL (ref 3.5–5.2)
Alkaline Phosphatase: 58 U/L (ref 39–117)
BUN: 15 mg/dL (ref 6–23)
CO2: 28 meq/L (ref 19–32)
Calcium: 9.4 mg/dL (ref 8.4–10.5)
Chloride: 103 meq/L (ref 96–112)
Creatinine, Ser: 0.9 mg/dL (ref 0.40–1.50)
GFR: 86.44 mL/min (ref >60.00)
Glucose, Bld: 102 mg/dL — ABNORMAL HIGH (ref 70–99)
Potassium: 4.9 meq/L (ref 3.5–5.1)
Sodium: 137 meq/L (ref 135–145)
Total Bilirubin: 0.9 mg/dL (ref 0.2–1.2)
Total Protein: 7 g/dL (ref 6.0–8.3)

## 2023-08-21 LAB — LIPID PANEL
Cholesterol: 106 mg/dL (ref 0–200)
HDL: 50.3 mg/dL (ref >39.00)
LDL Cholesterol: 44 mg/dL (ref 0–99)
NonHDL: 56.18
Total CHOL/HDL Ratio: 2
Triglycerides: 61 mg/dL (ref 0.0–149.0)
VLDL: 12.2 mg/dL (ref 0.0–40.0)

## 2023-08-21 LAB — CBC WITH DIFFERENTIAL/PLATELET
Basophils Absolute: 0 10*3/uL (ref 0.0–0.1)
Basophils Relative: 0.8 % (ref 0.0–3.0)
Eosinophils Absolute: 0.2 10*3/uL (ref 0.0–0.7)
Eosinophils Relative: 3.6 % (ref 0.0–5.0)
HCT: 39.7 % (ref 39.0–52.0)
Hemoglobin: 13.3 g/dL (ref 13.0–17.0)
Lymphocytes Relative: 40.6 % (ref 12.0–46.0)
Lymphs Abs: 2.1 10*3/uL (ref 0.7–4.0)
MCHC: 33.6 g/dL (ref 30.0–36.0)
MCV: 89.9 fl (ref 78.0–100.0)
Monocytes Absolute: 0.5 10*3/uL (ref 0.1–1.0)
Monocytes Relative: 8.8 % (ref 3.0–12.0)
Neutro Abs: 2.4 10*3/uL (ref 1.4–7.7)
Neutrophils Relative %: 46.2 % (ref 43.0–77.0)
Platelets: 193 10*3/uL (ref 150.0–400.0)
RBC: 4.42 Mil/uL (ref 4.22–5.81)
RDW: 13.8 % (ref 11.5–15.5)
WBC: 5.2 10*3/uL (ref 4.0–10.5)

## 2023-08-21 LAB — HEMOGLOBIN A1C: Hgb A1c MFr Bld: 5.7 % (ref 4.6–6.5)

## 2023-08-21 LAB — PSA, MEDICARE: PSA: 0.76 ng/mL (ref 0.10–4.00)

## 2023-08-21 LAB — VITAMIN D 25 HYDROXY (VIT D DEFICIENCY, FRACTURES): VITD: 45.75 ng/mL (ref 30.00–100.00)

## 2023-08-21 NOTE — Patient Instructions (Addendum)
 [facility] contact Please call to schedule visit if you don't hear within a week Address: [address] [place], [place], [place] [address] Phone: [phone]   For you try to get 8 am a year out for physical and we can add testosterone  Please stop by lab before you go If you have mychart- we will send your results within 3 business days of [place] receiving them.  If you do not have mychart- we will call you about results within 5 business days of [place] receiving them.  *please also note that you will see labs on mychart as soon as they post. I will later go in and write notes on them- will say "notes from Dr. Durene Cal"   Recommended follow up: Return in about 1 year (around 08/20/2024) for physical or sooner if needed.Schedule b4 you leave.

## 2023-08-21 NOTE — Progress Notes (Signed)
 Phone: [phone]   Subjective:  Patient presents today for their annual physical. Chief complaint-noted.   See problem oriented charting- ROS- full  review of systems was completed and negative  Per full ROS sheet completed by patient other than low testosterone concerns as below- low libido and erectile issues  The following were reviewed and entered/updated in epic: Past Medical History:  Diagnosis Date   Allergy 2015   Hyperlipidemia    Pre-syncope    Patient Active Problem List   Diagnosis Date Noted   CAD (coronary artery disease) 11/13/2021    Priority: High   Vitamin D deficiency 12/02/2017    Priority: Medium    Hyperlipidemia 12/20/2012    Priority: Medium    Constipation 09/25/2015    Priority: Low   History of adenomatous polyp of colon 09/18/2014    Priority: Low   Dizziness 12/07/2018   Past Surgical History:  Procedure Laterality Date   COLONOSCOPY  04/18/2015   Stark-polyp   none     POLYPECTOMY      Family History  Problem Relation Age of Onset   Uterine cancer Mother        75   Cancer Mother    Coronary artery disease Father        age 60   Arthritis Father    Heart disease Father    Diabetes Mellitus I Daughter    Hypertension Brother    Cancer Brother    Cancer Sister    Diabetes Daughter    Colon cancer Neg Hx    Colon polyps Neg Hx    Esophageal cancer Neg Hx    Rectal cancer Neg Hx    Stomach cancer Neg Hx     Medications- reviewed and updated Current Outpatient Medications  Medication Sig Dispense Refill   aspirin EC 81 MG tablet Take 81 mg by mouth daily. Swallow whole.     Cholecalciferol 50 MCG (2000 UT) CAPS 1 capsule every day by oral route.     DENTA 5000 PLUS 1.1 % CREA dental cream Take by mouth.     Evolocumab (REPATHA SURECLICK) 140 MG/ML SOAJ INJECT 140 MG INTO THE SKIN EVERY 14 (FOURTEEN) DAYS. 6 mL 1   Magnesium Gluconate (MAGONATE PO) Take 800 mg by mouth.     rosuvastatin (CRESTOR) 5 MG tablet TAKE 1  TABLET BY MOUTH EVERY OTHER DAY 45 tablet 3   No current facility-administered medications for this visit.    Allergies-reviewed and updated No Known Allergies  Social History   Social History Narrative   Married. 1 daughter. No grandkids.    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       Hobbies: time at home, yardwork   Objective  Objective:  BP 120/80   Pulse (!) 52   Temp (!) 97.1 F (36.2 C)   Ht 5' 10.5" (1.791 m)   Wt 181 lb 1.6 oz (82.1 kg)   SpO2 98%   BMI 25.62 kg/m  Gen: NAD, resting comfortably HEENT: Mucous membranes are moist. Oropharynx normal Neck: no thyromegaly CV: RRR no murmurs rubs or gallops Lungs: CTAB no crackles, wheeze, rhonchi Abdomen: soft/nontender/nondistended/normal bowel sounds. No rebound or guarding.  Ext: no edema Skin: warm, dry Neuro: grossly normal, moves all extremities, PERRLA Declines genitourinary or rectal exam   Assessment and Plan  73 y.o. male presenting for annual physical.  Health Maintenance counseling: 1. Anticipatory guidance: Patient counseled regarding regular dental  exams -q6 months, eye exams - generally yearly,  avoiding smoking and second hand smoke , limiting alcohol to 2 beverages per day - doesn't drink, no illicit drugs .   2. Risk factor reduction:  Advised patient of need for regular exercise and diet rich and fruits and vegetables to reduce risk of heart attack and stroke.  Exercise- 2.5 miles 5 days a week.  Diet/weight management-weight within 3 lbs of last year- reasonably healthy diet- focus on weight maintenance.  Wt Readings from Last 3 Encounters:  08/21/23 181 lb 1.6 oz (82.1 kg)  03/23/23 181 lb (82.1 kg)  11/17/22 184 lb (83.5 kg)  3. Immunizations/screenings/ancillary studies- opts out of COVID but otherwise up to date  Immunization History  Administered Date(s) Administered   Fluad Quad(high Dose 65+) 03/16/2022   Influenza Split 03/03/2013    Influenza, High Dose Seasonal PF 03/19/2018   Influenza,inj,Quad PF,6+ Mos 02/28/2014, 03/08/2015, 02/26/2016, 03/17/2017   Influenza-Unspecified 03/19/2018, 03/01/2019, 04/01/2021   Moderna SARS-COV2 Booster Vaccination 05/09/2020   Moderna Sars-Covid-2 Vaccination 07/13/2019, 08/11/2019   PNEUMOCOCCAL CONJUGATE-20 11/06/2020   Tdap 01/19/2022   Zoster Recombinant(Shingrix) 01/19/2022, 05/06/2022   Zoster, Live 05/16/2013   4. Prostate cancer screening- low risk prior trend- update psa today   Lab Results  Component Value Date   PSA 0.60 11/13/2021   PSA 0.43 11/06/2020   PSA 0.39 11/03/2019   5. Colon cancer screening - History of adenomatous polyps of the colon with Dr. Gillis Ends May 2022 with 3-year repeat planned- refer today  6. Skin cancer screening- sees Dr. Emily Filbert yearly. advised regular sunscreen use. Denies worrisome, changing, or new skin lesions.  7. Smoking associated screening (lung cancer screening, AAA screen 65-75, UA)- never smoker 8. STD screening - only active with wife  Status of chronic or acute concerns   #CAD-coronary artery calcium score of 752 which is 87th percentile for age November 17, 2019 follows with Dr. Hilty/lipid clinic-with elevated lipoprotein a #hyperlipidemia S: Medication: Praluent 150 mg every 14 days, aspirin 81 mg, Crestor 5 mg every other day  -low risk Myoview stress test without ischemia (02/2020) -no chest pain or shortness of breath   A/P:#s have looked good and update today for lipids Coronary artery disease asymptomatic - continue current medications    #Vitamin D deficiency S: Medication: 2000 units a day Last vitamin D Lab Results  Component Value Date   VD25OH 58.05 11/13/2021  A/P: hopefully stable- update vitamin D today. Continue current meds for now    #low libido/erectile issues- not getting morning erections- will check testosterone - wants to check next year- prefers to not come back this year for labs 8-9  am  Recommended follow up: Return in about 1 year (around 08/20/2024) for physical or sooner if needed.Schedule b4 you leave. Future Appointments  Date Time Provider Department Center  11/30/2023 10:15 AM [facility] ANNUAL WELLNESS VISIT 1 [facility]   Lab/Order associations: fasting   ICD-10-CM   1. Preventative health care  Z00.00     2. Coronary artery disease involving native coronary artery of native heart without angina pectoris  I25.10     3. Hyperlipidemia, unspecified hyperlipidemia type  E78.5 Comprehensive metabolic panel    CBC with Differential/Platelet    Lipid panel    4. Vitamin D deficiency  E55.9 VITAMIN D 25 Hydroxy (Vit-D Deficiency, Fractures)    5. Screening for prostate cancer  Z12.5 PSA, Medicare    6. Screening for diabetes mellitus  Z13.1 Hemoglobin A1c  7. Overweight  E66.3 Hemoglobin A1c    8. Drug-induced myopathy  G72.0     9. History of adenomatous polyp of colon  Z86.0101 Ambulatory referral to Gastroenterology      No orders of the defined types were placed in this encounter.   Return precautions advised.  Tana Conch, MD

## 2023-10-06 NOTE — Progress Notes (Unsigned)
 10/07/2023 Mike Hall [id number] 11-03-1950  Referring provider: Almira Jaeger, MD Primary GI doctor: Dr. Yvone Hall ( Dr. Sandrea Hall)  ASSESSMENT AND PLAN:   Dysphagia Can be with solids, occ with liquids, last 6 months has been worse, had episode last month with possible food impaction with regurg Mild GERD, will take tums, no nausea, vomiting No NSAIDS, no ETOH, no neck radiation/surgery, no tobacco use Potential etiologies include stricture, esophagitis ( infectious, eosinophilic, reflux), or esophageal dysmotility. Schedule EGD with dilatation and colonoscopy, to evaluate for stenosis, tumor, erosive/infectious esophagititis, and EOE. Due to issues with liquids and solids, and some concern about the size of the stenosis, will schedule for barium swallow before the EGD, if severe narrowing, consider moving to the hospital for peds scope, evaluate for achalasia  PPI trial 40 mg protonix daily I discussed risks of EGD with patient today, including risk of sedation, bleeding or perforation.  Patient provides understanding and gave verbal consent to proceed. In the interim patient advised about swallowing precautions.  Eat slowly, chew food well before swallowing.  Drink liquids in between each bite to avoid food impaction.  Constipation - Increase fiber/ water intake, decrease caffeine, increase activity level. -continue magnesium  Personal history of tubular adenomatous polyps colonoscopy 10/11/2020  good prep 3 polyps 5 to 8 mm, internal hemorrhoids, recall 3 years  CAD nonobstructive Calcium  cardiac score 2021 752, 87th percentile noted LAD and left circumflex 2021 nuclear stress test did not show any reversible ischemia No chest pain/SOB Follows Dr. Maximo Hall  I have reviewed the clinic note as outlined by Mike Cull, PA and agree with the assessment, plan and medical decision making.  Mike Hall presents for evaluation of progressive dysphagia to solids and liquids.   Reports a history of a food impaction.  Symptoms could be due to GERD, EOE, stenosis/ring or space-occupying lesion.  Agree with PPI trial and EGD he is also due for surveillance colonoscopy for history of 3 tubular adenomas in 2022.  Reasonable to proceed with colonoscopy at same time as EGD.  Mike Hess, MD   Patient Care Team: Mike Jaeger, MD as PCP - General (Family Medicine) Mike Fill, MD as Consulting Physician (Dermatology) Mike Blacksmith, MD (Inactive) as Consulting Physician (Gastroenterology)  HISTORY OF PRESENT ILLNESS: 73 y.o. male with a past medical history listed below presents for evaluation of dysphagia.   Discussed the use of AI scribe software for clinical note transcription with the patient, who gave verbal consent to proceed.  History of Present Illness   Mike Hall is a 73 year old male who presents with swallowing difficulties.  He has been experiencing swallowing difficulties for approximately six months, characterized by the sensation of food or liquid getting stuck in his throat, particularly when drinking or eating. This sensation resolves after a few minutes. The episodes occur randomly and are more frequent with solids than liquids. About a month ago, he had a choking episode where he felt food was stuck, causing him to heave without vomiting. No breathing difficulties have been experienced during these episodes.  He occasionally experiences mild heartburn, for which he takes Tums infrequently. No nausea, vomiting, dark stools, or blood in the stool. He has a history of constipation, which he manages with magnesium twice daily, helping him stay regular. No recent weight loss, abdominal pain, or changes in appetite.  His past medical history includes a colonoscopy in 2022, which revealed three polyps measuring 5 to 8 millimeters and hemorrhoids.  He is due for a follow-up colonoscopy in May 2025. He also has a history of nonobstructive plaque, with a  cardiac calcium  score and a nuclear stress test performed in 2021, both of which were unremarkable. No chest pain or shortness of breath.  He has no history of neck surgeries, radiation, smoking, or alcohol use. He is currently taking Praluent  and a low-dose aspirin.      He  reports that he has never smoked. He has never used smokeless tobacco. He reports that he does not drink alcohol and does not use drugs.  RELEVANT GI HISTORY, IMAGING AND LABS: Results   RADIOLOGY Cardiac calcium  score: Nonobstructive plaque (2021)  DIAGNOSTIC Colonoscopy: Three polyps, 5-8 mm, hemorrhoids (2022) Nuclear stress test: Normal (2021)      CBC    Component Value Date/Time   WBC 5.2 08/21/2023 1025   RBC 4.42 08/21/2023 1025   HGB 13.3 08/21/2023 1025   HCT 39.7 08/21/2023 1025   PLT 193.0 08/21/2023 1025   MCV 89.9 08/21/2023 1025   MCV 90.1 05/16/2013 1625   MCH 29.8 12/07/2018 0247   MCHC 33.6 08/21/2023 1025   RDW 13.8 08/21/2023 1025   LYMPHSABS 2.1 08/21/2023 1025   MONOABS 0.5 08/21/2023 1025   EOSABS 0.2 08/21/2023 1025   BASOSABS 0.0 08/21/2023 1025   Recent Labs    08/21/23 1025  HGB 13.3    CMP     Component Value Date/Time   NA 137 08/21/2023 1025   NA 140 09/26/2014 0000   K 4.9 08/21/2023 1025   CL 103 08/21/2023 1025   CO2 28 08/21/2023 1025   GLUCOSE 102 (H) 08/21/2023 1025   BUN 15 08/21/2023 1025   BUN 13 09/26/2014 0000   CREATININE 0.90 08/21/2023 1025   CREATININE 0.90 12/20/2012 1541   CALCIUM  9.4 08/21/2023 1025   PROT 7.0 08/21/2023 1025   PROT 7.0 05/16/2013 1514   ALBUMIN 4.7 08/21/2023 1025   ALBUMIN 4.6 05/16/2013 1514   AST 26 08/21/2023 1025   ALT 20 08/21/2023 1025   ALKPHOS 58 08/21/2023 1025   BILITOT 0.9 08/21/2023 1025   GFRNONAA >60 12/07/2018 0247   GFRNONAA >89 12/20/2012 1541   GFRAA >60 12/07/2018 0247   GFRAA >89 12/20/2012 1541      Latest Ref Rng & Units 08/21/2023   10:25 AM 11/13/2021    8:54 AM 11/06/2020    9:43 AM   Hepatic Function  Total Protein 6.0 - 8.3 g/dL 7.0  6.8  7.0   Albumin 3.5 - 5.2 g/dL 4.7  4.5  4.7   AST 0 - 37 U/L 26  25  23    ALT 0 - 53 U/L 20  21  19    Alk Phosphatase 39 - 117 U/L 58  56  56   Total Bilirubin 0.2 - 1.2 mg/dL 0.9  0.8  0.9       Current Medications:    Current Outpatient Medications (Cardiovascular):    Evolocumab  (REPATHA  SURECLICK) 140 MG/ML SOAJ, INJECT 140 MG INTO THE SKIN EVERY 14 (FOURTEEN) DAYS.   rosuvastatin  (CRESTOR ) 5 MG tablet, TAKE 1 TABLET BY MOUTH EVERY OTHER DAY   Current Outpatient Medications (Analgesics):    aspirin EC 81 MG tablet, Take 81 mg by mouth daily. Swallow whole.   Current Outpatient Medications (Other):    Cholecalciferol 50 MCG (2000 UT) CAPS, 1 capsule every day by oral route.   DENTA 5000 PLUS 1.1 % CREA dental cream, Take by mouth.  Magnesium Gluconate (MAGONATE PO), Take 800 mg by mouth.   Na Sulfate-K Sulfate-Mg Sulfate concentrate (SUPREP) 17.5-3.13-1.6 GM/177ML SOLN, Take 1 kit (354 mLs total) by mouth once for 1 dose.   pantoprazole (PROTONIX) 40 MG tablet, Take 1 tablet (40 mg total) by mouth daily.  Medical History:  Past Medical History:  Diagnosis Date   Allergy 2015   Hyperlipidemia    Pre-syncope    Allergies: No Known Allergies   Surgical History:  He  has a past surgical history that includes none; Colonoscopy (04/18/2015); and Polypectomy. Family History:  His family history includes Arthritis in his father; Cancer in his brother, mother, and sister; Coronary artery disease in his father; Diabetes in his daughter; Diabetes Mellitus I in his daughter; Heart disease in his father; Hypertension in his brother; Uterine cancer in his mother.  REVIEW OF SYSTEMS  : All other systems reviewed and negative except where noted in the History of Present Illness.  PHYSICAL EXAM: BP 100/70 (BP Location: Left Arm, Patient Position: Sitting, Cuff Size: Normal)   Pulse 68   Ht 5\' 8"  (1.727 m) Comment: height  measured without shoes  Wt 179 lb 2 oz (81.3 kg)   BMI 27.24 kg/m  Physical Exam   GENERAL APPEARANCE: Well nourished, in no apparent distress. HEENT: No cervical lymphadenopathy, unremarkable thyroid , sclerae anicteric, conjunctiva pink. RESPIRATORY: Respiratory effort normal, breath sounds clear and equal bilaterally without rales, rhonchi, or wheezing. CARDIO: Regular rate and rhythm with no murmurs, rubs, or gallops, peripheral pulses intact. ABDOMEN: Soft, non-distended, active bowel sounds in all four quadrants, no tenderness to palpation, no rebound, no mass appreciated. RECTAL: Declines. MUSCULOSKELETAL: Full range of motion, normal gait, without edema. SKIN: Dry, intact without rashes or lesions. No jaundice. NEURO: Alert, oriented, no focal deficits. PSYCH: Cooperative, normal mood and affect. NECK: No cervical lymphadenopathy.      Edmonia Gottron, PA-C 10:47 AM

## 2023-10-07 ENCOUNTER — Encounter: Payer: Self-pay | Admitting: Physician Assistant

## 2023-10-07 ENCOUNTER — Ambulatory Visit: Admitting: Physician Assistant

## 2023-10-07 VITALS — BP 100/70 | HR 68 | Ht 68.0 in | Wt 179.1 lb

## 2023-10-07 DIAGNOSIS — Z860101 Personal history of adenomatous and serrated colon polyps: Secondary | ICD-10-CM | POA: Diagnosis not present

## 2023-10-07 DIAGNOSIS — R1310 Dysphagia, unspecified: Secondary | ICD-10-CM | POA: Diagnosis not present

## 2023-10-07 DIAGNOSIS — K219 Gastro-esophageal reflux disease without esophagitis: Secondary | ICD-10-CM | POA: Diagnosis not present

## 2023-10-07 DIAGNOSIS — I2510 Atherosclerotic heart disease of native coronary artery without angina pectoris: Secondary | ICD-10-CM | POA: Diagnosis not present

## 2023-10-07 DIAGNOSIS — K5900 Constipation, unspecified: Secondary | ICD-10-CM

## 2023-10-07 DIAGNOSIS — R1319 Other dysphagia: Secondary | ICD-10-CM

## 2023-10-07 MED ORDER — PANTOPRAZOLE SODIUM 40 MG PO TBEC: 40.0000 mg | DELAYED_RELEASE_TABLET | Freq: Every day | ORAL | 3 refills | Status: DC

## 2023-10-07 MED ORDER — NA SULFATE-K SULFATE-MG SULF 17.5-3.13-1.6 GM/177ML PO SOLN: 1.0000 | Freq: Once | ORAL | 0 refills | Status: AC

## 2023-10-07 NOTE — Patient Instructions (Addendum)
 _______________________________________________________  If your blood pressure at your visit was 140/90 or greater, please contact your primary care physician to follow up on this.  _______________________________________________________  If you are age 71 or older, your body mass index should be between 23-30. Your Body mass index is 27.24 kg/m. If this is out of the aforementioned range listed, please consider follow up with your Primary Care Provider.  If you are age 65 or younger, your body mass index should be between 19-25. Your Body mass index is 27.24 kg/m. If this is out of the aformentioned range listed, please consider follow up with your Primary Care Provider.   ________________________________________________________  The [facility] providers would like to encourage you to use MYCHART to communicate with providers for non-urgent requests or questions.  Due to long hold times on the telephone, sending your provider a message by [facility] may be a faster and more efficient way to get a response.  Please allow 48 business hours for a response.  Please remember that this is for non-urgent requests.  _______________________________________________________   Mike Hall have been scheduled for a Barium Esophogram at [facility] (Entrance A) on 10-13-23 at 10am. Please arrive 30 minutes prior to your appointment for registration. Make certain not to have anything to eat or drink 3 hours prior to your test. If you need to reschedule for any reason, please contact radiology at [phone] to do so. __________________________________________________________________ A barium swallow is an examination that concentrates on views of the esophagus. This tends to be a double contrast exam (barium and two liquids which, when combined, create a gas to distend the wall of the oesophagus) or single contrast (non-ionic iodine based). The study is usually tailored to your symptoms so a good history is  essential. Attention is paid during the study to the form, structure and configuration of the esophagus, looking for functional disorders (such as aspiration, dysphagia, achalasia, motility and reflux) EXAMINATION You may be asked to change into a gown, depending on the type of swallow being performed. A radiologist and radiographer will perform the procedure. The radiologist will advise you of the type of contrast selected for your procedure and direct you during the exam. You will be asked to stand, sit or lie in several different positions and to hold a small amount of fluid in your mouth before being asked to swallow while the imaging is performed .In some instances you may be asked to swallow barium coated marshmallows to assess the motility of a solid food bolus. The exam can be recorded as a digital or video fluoroscopy procedure. POST PROCEDURE It will take 1-2 days for the barium to pass through your system. To facilitate this, it is important, unless otherwise directed, to increase your fluids for the next 24-48hrs and to resume your normal diet.  This test typically takes about 30 minutes to perform. __________________________________________________________________________________ Mike Hall have been scheduled for an endoscopy and colonoscopy. Please follow the written instructions given to you at your visit today.  If you use inhalers (even only as needed), please bring them with you on the day of your procedure.  DO NOT TAKE 7 DAYS PRIOR TO TEST- Trulicity (dulaglutide) Ozempic, Wegovy (semaglutide) Mounjaro (tirzepatide) Bydureon Bcise (exanatide extended release)  DO NOT TAKE 1 DAY PRIOR TO YOUR TEST Rybelsus (semaglutide) Adlyxin (lixisenatide) Victoza (liraglutide) Byetta (exanatide) ___________________________________________________________________________    Dysphagia precautions:  1. Take reflux medications 30+ minutes before food in the morning 2. Begin meals with warm  beverage 3. Eat smaller  more frequent meals 4. Eat slowly, taking small bites and sips 5. Alternate solids and liquids 6. Avoid foods/liquids that increase acid production 7. Sit upright during and for 30+ minutes after meals to facilitate esophageal clearing 8. All meats should be chopped finely.   If something gets hung in your esophagus and will not come up or go down, proceed to the emergency room.    Please take your proton pump inhibitor medication, pantoprazole  Please take this medication 30 minutes to 1 hour before meals- this makes it more effective.  Avoid spicy and acidic foods Avoid fatty foods Limit your intake of coffee, tea, alcohol, and carbonated drinks Work to maintain a healthy weight Keep the head of the bed elevated at least 3 inches with blocks or a wedge pillow if you are having any nighttime symptoms Stay upright for 2 hours after eating Avoid meals and snacks three to four hours before bedtime Stop smoking

## 2023-10-13 ENCOUNTER — Ambulatory Visit (HOSPITAL_COMMUNITY)
Admission: RE | Admit: 2023-10-13 | Discharge: 2023-10-13 | Disposition: A | Discharge status: Home / Self Care | Source: Ambulatory Visit | Attending: Physician Assistant | Admitting: Physician Assistant

## 2023-10-13 DIAGNOSIS — R1319 Other dysphagia: Secondary | ICD-10-CM | POA: Diagnosis not present

## 2023-10-13 DIAGNOSIS — R1310 Dysphagia, unspecified: Secondary | ICD-10-CM | POA: Diagnosis not present

## 2023-10-13 DIAGNOSIS — K449 Diaphragmatic hernia without obstruction or gangrene: Secondary | ICD-10-CM | POA: Diagnosis not present

## 2023-10-15 ENCOUNTER — Emergency Department (HOSPITAL_BASED_OUTPATIENT_CLINIC_OR_DEPARTMENT_OTHER)
Admission: EM | Admit: 2023-10-15 | Discharge: 2023-10-15 | Disposition: A | Discharge status: Home / Self Care | Attending: Emergency Medicine | Admitting: Emergency Medicine

## 2023-10-15 ENCOUNTER — Other Ambulatory Visit: Payer: Self-pay

## 2023-10-15 ENCOUNTER — Telehealth: Payer: Self-pay | Admitting: Internal Medicine

## 2023-10-15 ENCOUNTER — Emergency Department (HOSPITAL_BASED_OUTPATIENT_CLINIC_OR_DEPARTMENT_OTHER): Admitting: Radiology

## 2023-10-15 ENCOUNTER — Encounter (HOSPITAL_BASED_OUTPATIENT_CLINIC_OR_DEPARTMENT_OTHER): Payer: Self-pay

## 2023-10-15 DIAGNOSIS — M25511 Pain in right shoulder: Secondary | ICD-10-CM | POA: Diagnosis not present

## 2023-10-15 DIAGNOSIS — I2510 Atherosclerotic heart disease of native coronary artery without angina pectoris: Secondary | ICD-10-CM | POA: Diagnosis not present

## 2023-10-15 DIAGNOSIS — R0789 Other chest pain: Secondary | ICD-10-CM | POA: Diagnosis not present

## 2023-10-15 DIAGNOSIS — Z87891 Personal history of nicotine dependence: Secondary | ICD-10-CM | POA: Diagnosis not present

## 2023-10-15 DIAGNOSIS — M542 Cervicalgia: Secondary | ICD-10-CM | POA: Diagnosis not present

## 2023-10-15 DIAGNOSIS — R202 Paresthesia of skin: Secondary | ICD-10-CM | POA: Diagnosis not present

## 2023-10-15 DIAGNOSIS — I700 Atherosclerosis of aorta: Secondary | ICD-10-CM | POA: Diagnosis not present

## 2023-10-15 DIAGNOSIS — Z7982 Long term (current) use of aspirin: Secondary | ICD-10-CM | POA: Diagnosis not present

## 2023-10-15 DIAGNOSIS — M25512 Pain in left shoulder: Secondary | ICD-10-CM | POA: Insufficient documentation

## 2023-10-15 DIAGNOSIS — I10 Essential (primary) hypertension: Secondary | ICD-10-CM | POA: Diagnosis not present

## 2023-10-15 DIAGNOSIS — E119 Type 2 diabetes mellitus without complications: Secondary | ICD-10-CM | POA: Diagnosis not present

## 2023-10-15 LAB — BASIC METABOLIC PANEL WITH GFR
Anion gap: 12 (ref 5–15)
BUN: 19 mg/dL (ref 8–23)
CO2: 22 mmol/L (ref 22–32)
Calcium: 9.7 mg/dL (ref 8.9–10.3)
Chloride: 104 mmol/L (ref 98–111)
Creatinine, Ser: 1.03 mg/dL (ref 0.61–1.24)
GFR, Estimated: >60 mL/min (ref >60)
Glucose, Bld: 100 mg/dL — ABNORMAL HIGH (ref 70–99)
Potassium: 5.1 mmol/L (ref 3.5–5.1)
Sodium: 138 mmol/L (ref 135–145)

## 2023-10-15 LAB — CBC
HCT: 38.9 % — ABNORMAL LOW (ref 39.0–52.0)
Hemoglobin: 13.1 g/dL (ref 13.0–17.0)
MCH: 31 pg (ref 26.0–34.0)
MCHC: 33.7 g/dL (ref 30.0–36.0)
MCV: 92 fL (ref 80.0–100.0)
Platelets: 143 10*3/uL — ABNORMAL LOW (ref 150–400)
RBC: 4.23 MIL/uL (ref 4.22–5.81)
RDW: 12.8 % (ref 11.5–15.5)
WBC: 6.8 10*3/uL (ref 4.0–10.5)
nRBC: 0 % (ref 0.0–0.2)

## 2023-10-15 LAB — TROPONIN T, HIGH SENSITIVITY: Troponin T High Sensitivity: <15 ng/L (ref <19)

## 2023-10-15 NOTE — ED Provider Notes (Signed)
 [place] EMERGENCY DEPARTMENT AT [facility] Provider Note   CSN: [id number] Arrival date & time: 10/15/23  1315     History  Chief Complaint  Patient presents with   Arm Pain    Left     Tingling    Mike Hall is a 73 y.o. male with past medical history significant for HLD, with no hx of HTN, diabetes, tobacco abuse who presents with neck pain radiating into bilateral shoulders, left greater than right. No recent injury. Denies nausea, vomiting, Chest pain. No hx of acs. Some family, history. Father died of heart attack at 73.    Arm Pain       Home Medications Prior to Admission medications   Medication Sig Start Date End Date Taking? Authorizing Provider  aspirin EC 81 MG tablet Take 81 mg by mouth daily. Swallow whole.    [provider]  Cholecalciferol 50 MCG (2000 UT) CAPS 1 capsule every day by oral route. 06/09/16   [provider]  DENTA 5000 PLUS 1.1 % CREA dental cream Take by mouth. 10/30/21   [provider]  Evolocumab  (REPATHA  SURECLICK) 140 MG/ML SOAJ INJECT 140 MG INTO THE SKIN EVERY 14 (FOURTEEN) DAYS. 07/24/23   Hilty, Aviva Lemmings, MD  Magnesium Gluconate (MAGONATE PO) Take 800 mg by mouth.    [provider]  pantoprazole  (PROTONIX ) 40 MG tablet Take 1 tablet (40 mg total) by mouth daily. 10/07/23   Edmonia Gottron, PA-C  rosuvastatin  (CRESTOR ) 5 MG tablet TAKE 1 TABLET BY MOUTH EVERY OTHER DAY 03/27/23   Hilty, Aviva Lemmings, MD      Allergies    Patient has no known allergies.    Review of Systems   Review of Systems  All other systems reviewed and are negative.   Physical Exam Updated Vital Signs BP 127/83   Pulse 78   Temp 98 F (36.7 C)   Resp 18   Ht 5\' 8"  (1.727 m)   Wt 81.6 kg   SpO2 99%   BMI 27.37 kg/m  Physical Exam Vitals and nursing note reviewed.  Constitutional:      General: He is not in acute distress.    Appearance: Normal appearance.  HENT:     Head: Normocephalic and  atraumatic.  Eyes:     General:        Right eye: No discharge.        Left eye: No discharge.  Cardiovascular:     Rate and Rhythm: Normal rate and regular rhythm.     Heart sounds: No murmur heard.    No friction rub. No gallop.  Pulmonary:     Effort: Pulmonary effort is normal.     Breath sounds: Normal breath sounds.     Comments: No wheezing, rhonchi, stridor, rales Abdominal:     General: Bowel sounds are normal.     Palpations: Abdomen is soft.  Musculoskeletal:     Comments: Pain in cervical paraspinous muscles, no midline spinal tenderness. Normal strength 5/5 of bilateral upper extremities  Skin:    General: Skin is warm and dry.     Capillary Refill: Capillary refill takes less than 2 seconds.  Neurological:     Mental Status: He is alert and oriented to person, place, and time.  Psychiatric:        Mood and Affect: Mood normal.        Behavior: Behavior normal.     ED Results / Procedures / Treatments  Labs (all labs ordered are listed, but only abnormal results are displayed) Labs Reviewed  BASIC METABOLIC PANEL WITH GFR - Abnormal; Notable for the following components:      Result Value   Glucose, Bld 100 (*)    All other components within normal limits  CBC - Abnormal; Notable for the following components:   HCT 38.9 (*)    Platelets 143 (*)    All other components within normal limits  TROPONIN T, HIGH SENSITIVITY    EKG None  Radiology DG Chest 2 View Result Date: 10/15/2023 CLINICAL DATA:  Tingling. EXAM: CHEST - 2 VIEW COMPARISON:  None Available. FINDINGS: No focal consolidation, pleural effusion, pneumothorax. The cardiac silhouette is within limits. Atherosclerotic calcification of the aortic arch. No acute osseous pathology. IMPRESSION: No active cardiopulmonary disease. Electronically Signed   By: Angus Bark M.D.   On: 10/15/2023 14:26    Procedures Procedures    Medications Ordered in ED Medications - No data to display  ED  Course/ Medical Decision Making/ A&P                                 Medical Decision Making Amount and/or Complexity of Data Reviewed Labs: ordered. Radiology: ordered.   This patient is a 73 y.o. male  who presents to the ED for concern of left arm, neck, shoulder pain since last night with no chest pain.   Differential diagnoses prior to evaluation: The emergent differential diagnosis includes, but is not limited to, overall based on his presentation mostly concern for musculoskeletal etiology of his pain, but given his age, history of hyperlipidemia considered atypical presentation of chest pain as well, ACS, AAS, PE, Mallory-Weiss, Boerhaave's, Pneumonia, acute bronchitis, asthma or COPD exacerbation, anxiety, MSK pain or traumatic injury to the chest, acid reflux versus other. This is not an exhaustive differential.   Past Medical History / Co-morbidities / Social History: HLD, CAD  Additional history: Chart reviewed. Pertinent results include: no previous echo or stress test  Physical Exam: Physical exam performed. The pertinent findings include: Vital signs stable, not hypertensive, no wheezing, rhonchi, stridor, rales, some mild tenderness to palpation in the cervical paraspinous muscles, no significant midline spinal tenderness.  Lab Tests/Imaging studies: I personally interpreted labs/imaging and the pertinent results include: CBC unremarkable, BMP unremarkable, 1 send troponin unremarkable in context of no chest pain but some atypical left-sided arm pain since last night with no pain at time of evaluation.  I independently interpreted plain film chest x-ray which shows no evidence of acute intrathoracic abnormality.. I agree with the radiologist interpretation.  Cardiac monitoring: EKG obtained and interpreted by myself and attending physician which shows: NSR, no acute st-t changes   Medications: Encouraged ibuprofen, tylenol , ortho follow up as needed. Given hx of CAD,  HLD, age, encouraged echo, stress test  Disposition: After consideration of the diagnostic results and the patients response to treatment, I feel that patient stable for discharge at this time, close follow up .   emergency department workup does not suggest an emergent condition requiring admission or immediate intervention beyond what has been performed at this time. The plan is: as above. The patient is safe for discharge and has been instructed to return immediately for worsening symptoms, change in symptoms or any other concerns.  Final Clinical Impression(s) / ED Diagnoses Final diagnoses:  Atypical chest pain  Neck pain  Acute pain of both shoulders  Rx / DC Orders ED Discharge Orders          Ordered    Ambulatory referral to Cardiology        10/15/23 [address] 10/15/23 1517    Hershel Los, MD 10/21/23 [phone]

## 2023-10-15 NOTE — Telephone Encounter (Signed)
 Pt describing pain in left shoulder that is radiating down left arm that started last night and this morning was in both shoulders. Has had some achiness for some time now in arms but was worse last night along with shoulder pain. Requesting cb

## 2023-10-15 NOTE — Telephone Encounter (Signed)
 Spoke with Pt. Pt stated last night he started having left shoulder pain that moved down his arm. This morning it feels like a ache or numbest in his shoulder and arm. Pt denies SOB, chest pain, or dizziness. Pt is unable to give any BP or HR readings at this time and stated that he may have a BP cuff around but his wife will not be home for another hour and he doesn't know where it is. Pt was unable to answer some of my questions. Told pt my recommendation would be to be seen in the nearest urgent care or ED with the information I have. Pt stated understanding and stated his wife may want to call back once she is home.

## 2023-10-15 NOTE — Telephone Encounter (Signed)
 Returned call to patient and his wife. Advised that Protonix  should not cause shoulder pain. Patient reached out to cardiology this morning and they advised that he go to urgent care or ER for evaluation. I advised that patient should proceed as directed. May need EKG and updated labs to rule our cardiac issue. Pt's wife wanted to know if anything on the barium esophagram could be causing shoulder pain/aches, I reviewed results with them and advised that only thing it showed was small hiatal hernia. Patient and his wife verbalized understanding and had no concerns at the end of the call.

## 2023-10-15 NOTE — Discharge Instructions (Addendum)
 Please use Tylenol  or ibuprofen for pain.  You may use 600 mg ibuprofen every 6 hours or 1000 mg of Tylenol  every 6 hours.  You may choose to alternate between the 2.  This would be most effective.  Not to exceed 4 g of Tylenol  within 24 hours.  Not to exceed 3200 mg ibuprofen 24 hours.  Follow up with orthopedics as needed if pain persists despite treatment.

## 2023-10-15 NOTE — Telephone Encounter (Signed)
 Inbound call from patient's wife stating patient has been having shoulder pain. Requesting to know if this is a side effect of medication. Requesting a call at [phone]. Please advise,

## 2023-10-15 NOTE — ED Triage Notes (Addendum)
 Patient arrives ambulatory to the ED with complaints of left arm pain radiating to his shoulder (with tingling) since yesterday. Recommended here by Triage Nurse to have labs drawn.

## 2023-11-10 ENCOUNTER — Encounter: Payer: Self-pay | Admitting: Pediatrics

## 2023-11-13 ENCOUNTER — Telehealth: Payer: Self-pay | Admitting: Physician Assistant

## 2023-11-13 NOTE — Telephone Encounter (Signed)
 PT is scheduled for an EGD/colon on 6/11 and needs to get an injection is knee a few days prior.PT wife wants to know will he still be able to still have procedure. Please advise.

## 2023-11-13 NOTE — Telephone Encounter (Signed)
 Spoke with pt wife Ninette Basque. Ninette Basque stated that the pt had a office visit on Monday to see the Dr about his knee and questioned if it was OK for him to have an injection in his knee if they offered. Ninette Basque was notified that it was [facility] for him to get an injection and for him to proceed with the procedures as scheduled. Ninette Basque  verbalized understanding with all questions answered.

## 2023-11-16 DIAGNOSIS — M25561 Pain in right knee: Secondary | ICD-10-CM | POA: Diagnosis not present

## 2023-11-16 NOTE — Progress Notes (Unsigned)
  Gastroenterology History and Physical   Primary Care Physician:  Almira Jaeger, MD   Reason for Procedure:  Dysphagia, surveillance of adenomatous colon polyps  Plan:    Upper endoscopy and colonoscopy     HPI: Mike Hall is a 73 y.o. male undergoing upper endoscopy and colonoscopy for evaluation of dysphagia as well as surveillance of adenomatous colon polyps.  Patient was recently seen in clinic reporting dysphagia predominantly to solids but intermittently with liquids.  Endorsed food impaction with regurgitation.  Mild GERD.  Barium esophagram showed a normal esophagus with the exception of a small hiatal hernia.  13 mm barium tablet passed.    Patient's last colonoscopy was performed in 2022.  This revealed 3 tubular adenomas measuring 5 to 8 mm.  No family history of colorectal cancer or polyps.  Patient has had some symptoms of constipation but no other change in bowel habits.   Past Medical History:  Diagnosis Date   Allergy 2015   Hyperlipidemia    Pre-syncope     Past Surgical History:  Procedure Laterality Date   COLONOSCOPY  04/18/2015   Stark-polyp   none     POLYPECTOMY      Prior to Admission medications   Medication Sig Start Date End Date Taking? Authorizing Provider  aspirin EC 81 MG tablet Take 81 mg by mouth daily. Swallow whole.    [provider]  Cholecalciferol 50 MCG (2000 UT) CAPS 1 capsule every day by oral route. 06/09/16   [provider]  DENTA 5000 PLUS 1.1 % CREA dental cream Take by mouth. 10/30/21   [provider]  Evolocumab  (REPATHA  SURECLICK) 140 MG/ML SOAJ INJECT 140 MG INTO THE SKIN EVERY 14 (FOURTEEN) DAYS. 07/24/23   Hilty, Aviva Lemmings, MD  Magnesium Gluconate (MAGONATE PO) Take 800 mg by mouth.    [provider]  pantoprazole  (PROTONIX ) 40 MG tablet Take 1 tablet (40 mg total) by mouth daily. 10/07/23   Edmonia Gottron, PA-C  rosuvastatin  (CRESTOR ) 5 MG tablet TAKE 1 TABLET BY MOUTH EVERY  OTHER DAY 03/27/23   Hazle Lites, MD    Current Outpatient Medications  Medication Sig Dispense Refill   aspirin EC 81 MG tablet Take 81 mg by mouth daily. Swallow whole.     Cholecalciferol 50 MCG (2000 UT) CAPS 1 capsule every day by oral route.     DENTA 5000 PLUS 1.1 % CREA dental cream Take by mouth.     Magnesium Gluconate (MAGONATE PO) Take 800 mg by mouth.     rosuvastatin  (CRESTOR ) 5 MG tablet TAKE 1 TABLET BY MOUTH EVERY OTHER DAY 45 tablet 3   Evolocumab  (REPATHA  SURECLICK) 140 MG/ML SOAJ INJECT 140 MG INTO THE SKIN EVERY 14 (FOURTEEN) DAYS. 6 mL 1   pantoprazole  (PROTONIX ) 40 MG tablet Take 1 tablet (40 mg total) by mouth daily. (Patient not taking: Reported on 11/18/2023) 30 tablet 3   Current Facility-Administered Medications  Medication Dose Route Frequency Provider Last Rate Last Admin   0.9 %  sodium chloride  infusion  500 mL Intravenous Once Leoncio Hansen, Scarlette Currier, MD        Allergies as of 11/18/2023   (No Known Allergies)    Family History  Problem Relation Age of Onset   Uterine cancer Mother        40   Cancer Mother    Coronary artery disease Father        age 72   Arthritis Father  Heart disease Father    Diabetes Mellitus I Daughter    Hypertension Brother    Cancer Brother    Cancer Sister    Diabetes Daughter    Colon cancer Neg Hx    Colon polyps Neg Hx    Esophageal cancer Neg Hx    Rectal cancer Neg Hx    Stomach cancer Neg Hx     Social History   Socioeconomic History   Marital status: Married    Spouse name: Not on file   Number of children: Not on file   Years of education: Not on file   Highest education level: Not on file  Occupational History   Not on file  Tobacco Use   Smoking status: Never   Smokeless tobacco: Never  Vaping Use   Vaping status: Never Used  Substance and Sexual Activity   Alcohol use: No   Drug use: No   Sexual activity: Yes    Birth control/protection: None  Other Topics Concern   Not on file   Social History Narrative   Married. 1 daughter. No grandkids.    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       Hobbies: time at home, yardwork   Social Drivers of Health   Financial Resource Strain: Low Risk  (11/14/2022)   Overall Financial Resource Strain (CARDIA)    Difficulty of Paying Living Expenses: Not hard at all  Food Insecurity: No Food Insecurity (11/14/2022)   Hunger Vital Sign    Worried About Running Out of Food in the Last Year: Never true    Ran Out of Food in the Last Year: Never true  Transportation Needs: No Transportation Needs (11/14/2022)   PRAPARE - Administrator, Civil Service (Medical): No    Lack of Transportation (Non-Medical): No  Physical Activity: Sufficiently Active (11/14/2022)   Exercise Vital Sign    Days of Exercise per Week: 5 days    Minutes of Exercise per Session: 40 min  Stress: No Stress Concern Present (11/14/2022)   [employer] of Occupational Health - Occupational Stress Questionnaire    Feeling of Stress : Not at all  Social Connections: Moderately Integrated (11/14/2022)   Social Connection and Isolation Panel [NHANES]    Frequency of Communication with Friends and Family: More than three times a week    Frequency of Social Gatherings with Friends and Family: Once a week    Attends Religious Services: More than 4 times per year    Active Member of [employer] or Organizations: No    Attends Banker Meetings: Never    Marital Status: Married  Catering manager Violence: Not At Risk (11/17/2022)   Humiliation, Afraid, Rape, and Kick questionnaire    Fear of Current or Ex-Partner: No    Emotionally Abused: No    Physically Abused: No    Sexually Abused: No    Review of Systems:  All other review of systems negative except as mentioned in the HPI.  Physical Exam: Vital signs BP (!) 151/100   Pulse 77   Temp 98.1 F (36.7 C) (Temporal)   Resp 15   Ht 5' 8 (1.727  m)   Wt 179 lb (81.2 kg)   SpO2 96%   BMI 27.22 kg/m   General:   Alert,  Well-developed, well-nourished, pleasant and cooperative in NAD Airway:  Mallampati 2 Lungs:  Clear throughout to auscultation.   Heart:  Regular  rate and rhythm; no murmurs, clicks, rubs,  or gallops. Abdomen:  Soft, nontender and nondistended. Normal bowel sounds.   Neuro/Psych:  Normal mood and affect. A and O x 3  Eugenia Hess, MD [facility]

## 2023-11-18 ENCOUNTER — Ambulatory Visit: Admitting: Pediatrics

## 2023-11-18 ENCOUNTER — Encounter: Payer: Self-pay | Admitting: Pediatrics

## 2023-11-18 VITALS — BP 134/78 | HR 56 | Temp 98.1°F | Resp 19 | Ht 68.0 in | Wt 179.0 lb

## 2023-11-18 DIAGNOSIS — K648 Other hemorrhoids: Secondary | ICD-10-CM

## 2023-11-18 DIAGNOSIS — K644 Residual hemorrhoidal skin tags: Secondary | ICD-10-CM

## 2023-11-18 DIAGNOSIS — R1319 Other dysphagia: Secondary | ICD-10-CM

## 2023-11-18 DIAGNOSIS — Z860100 Personal history of colon polyps, unspecified: Secondary | ICD-10-CM

## 2023-11-18 DIAGNOSIS — K2289 Other specified disease of esophagus: Secondary | ICD-10-CM | POA: Diagnosis not present

## 2023-11-18 DIAGNOSIS — K2980 Duodenitis without bleeding: Secondary | ICD-10-CM | POA: Diagnosis not present

## 2023-11-18 DIAGNOSIS — K2970 Gastritis, unspecified, without bleeding: Secondary | ICD-10-CM

## 2023-11-18 DIAGNOSIS — Z1211 Encounter for screening for malignant neoplasm of colon: Secondary | ICD-10-CM | POA: Diagnosis not present

## 2023-11-18 DIAGNOSIS — R1310 Dysphagia, unspecified: Secondary | ICD-10-CM | POA: Diagnosis not present

## 2023-11-18 DIAGNOSIS — Z860101 Personal history of adenomatous and serrated colon polyps: Secondary | ICD-10-CM | POA: Diagnosis not present

## 2023-11-18 DIAGNOSIS — K2950 Unspecified chronic gastritis without bleeding: Secondary | ICD-10-CM | POA: Diagnosis not present

## 2023-11-18 MED ORDER — SODIUM CHLORIDE 0.9 % IV SOLN: 500.0000 mL | Freq: Once | INTRAVENOUS | Status: DC

## 2023-11-18 NOTE — Progress Notes (Signed)
 Called to room to assist during endoscopic procedure.  Patient ID and intended procedure confirmed with present staff. Received instructions for my participation in the procedure from the performing physician.

## 2023-11-18 NOTE — Patient Instructions (Signed)
 Discharge instructions given. Handouts on Dilatation Diet,Gastritis and Hemorrhoids. Resume previous medications.YOU HAD AN ENDOSCOPIC PROCEDURE TODAY AT THE [facility]:   Refer to the procedure report that was given to you for any specific questions about what was found during the examination.  If the procedure report does not answer your questions, please call your gastroenterologist to clarify.  If you requested that your care partner not be given the details of your procedure findings, then the procedure report has been included in a sealed envelope for you to review at your convenience later.  YOU SHOULD EXPECT: Some feelings of bloating in the abdomen. Passage of more gas than usual.  Walking can help get rid of the air that was put into your GI tract during the procedure and reduce the bloating. If you had a lower endoscopy (such as a colonoscopy or flexible sigmoidoscopy) you may notice spotting of blood in your stool or on the toilet paper. If you underwent a bowel prep for your procedure, you may not have a normal bowel movement for a few days.  Please Note:  You might notice some irritation and congestion in your nose or some drainage.  This is from the oxygen used during your procedure.  There is no need for concern and it should clear up in a day or so.  SYMPTOMS TO REPORT IMMEDIATELY:  Following lower endoscopy (colonoscopy or flexible sigmoidoscopy):  Excessive amounts of blood in the stool  Significant tenderness or worsening of abdominal pains  Swelling of the abdomen that is new, acute  Fever of 100F or higher  Following upper endoscopy (EGD)  Vomiting of blood or coffee ground material  New chest pain or pain under the shoulder blades  Painful or persistently difficult swallowing  New shortness of breath  Fever of 100F or higher  Black, tarry-looking stools  For urgent or emergent issues, a gastroenterologist can be reached at any hour by calling [phone]  [phone]. Do not use MyChart messaging for urgent concerns.    DIET:  We do recommend a small meal at first, but then you may proceed to your regular diet.  Drink plenty of fluids but you should avoid alcoholic beverages for 24 hours.  ACTIVITY:  You should plan to take it easy for the rest of today and you should NOT DRIVE or use heavy machinery until tomorrow (because of the sedation medicines used during the test).    FOLLOW UP: Our staff will call the number listed on your records the next business day following your procedure.  We will call around 7:15- 8:00 am to check on you and address any questions or concerns that you may have regarding the information given to you following your procedure. If we do not reach you, we will leave a message.     If any biopsies were taken you will be contacted by phone or by letter within the next 1-3 weeks.  Please call us  at [phone] if you have not heard about the biopsies in 3 weeks.    SIGNATURES/CONFIDENTIALITY: You and/or your care partner have signed paperwork which will be entered into your electronic medical record.  These signatures attest to the fact that that the information above on your After Visit Summary has been reviewed and is understood.  Full responsibility of the confidentiality of this discharge information lies with you and/or your care-partner.

## 2023-11-18 NOTE — Progress Notes (Signed)
 Vss nad trans to pacu

## 2023-11-18 NOTE — Op Note (Signed)
 [facility] Patient Name: Mike Hall Procedure Date: 11/18/2023 8:51 AM MRN: [id number] Endoscopist: Eugenia Hess , MD, [id number] Age: 73 Referring MD:  Date of Birth: [birth year] Gender: Male Account #: [id number] Procedure:                Upper GI endoscopy Indications:              Dysphagia, Normal barium esophagram Medicines:                Monitored Anesthesia Care Procedure:                Pre-Anesthesia Assessment:                           - Prior to the procedure, a History and Physical                            was performed, and patient medications and                            allergies were reviewed. The patient's tolerance of                            previous anesthesia was also reviewed. The risks                            and benefits of the procedure and the sedation                            options and risks were discussed with the patient.                            All questions were answered, and informed consent                            was obtained. Prior Anticoagulants: The patient has                            taken no anticoagulant or antiplatelet agents. ASA                            Grade Assessment: II - A patient with mild systemic                            disease. After reviewing the risks and benefits,                            the patient was deemed in satisfactory condition to                            undergo the procedure.                           After obtaining informed consent, the endoscope was  passed under direct vision. Throughout the                            procedure, the patient's blood pressure, pulse, and                            oxygen saturations were monitored continuously. The                            Olympus scope [id number] was introduced through the                            mouth, and advanced to the second part of duodenum.                            The upper GI  endoscopy was accomplished without                            difficulty. The patient tolerated the procedure                            well. Scope In: 9:01:17 AM Scope Out: 9:17:23 AM Total Procedure Duration: 0 hours 16 minutes 6 seconds  Findings:                 No endoscopic abnormality was evident in the                            esophagus to explain the patient's complaint of                            dysphagia. A guidewire was placed and the scope was                            withdrawn. Dilation was performed with a Savary                            dilator with no resistance at 15 mm and 16 mm and                            moderate resistance at 17 mm. Biopsies were                            obtained from the proximal and distal esophagus                            with cold forceps for histology for evaluation of                            eosinophilic esophagitis.                           Localized moderate inflammation characterized by  erosions, erythema and linear erosions was found in                            the prepyloric region of the stomach. Biopsies were                            taken with a cold forceps for Helicobacter pylori                            testing.                           The gastric body, gastric antrum, cardia (on                            retroflexion) and gastric fundus (on retroflexion)                            were normal. Biopsies were taken with a cold                            forceps for Helicobacter pylori testing.                           Localized mild inflammation characterized by                            erosions and erythema was found in the duodenal                            bulb.                           The second portion of the duodenum was normal. Complications:            No immediate complications. Estimated blood loss:                            Minimal. Estimated Blood Loss:      Estimated blood loss was minimal. Impression:               - No endoscopic esophageal abnormality to explain                            patient's dysphagia. Savary Dilated to 17 mm                           - Gastritis. Biopsied.                           - Normal gastric body, antrum, cardia and gastric                            fundus. Biopsied.                           -  Duodenitis.                           - Normal second portion of the duodenum.                           - Biopsies were taken with a cold forceps for                            evaluation of eosinophilic esophagitis. Recommendation:           - Await pathology results.                           - Perform a colonoscopy today.                           - The findings and recommendations were discussed                            with the patient's family. Eugenia Hess, MD 11/18/2023 9:40:50 AM This report has been signed electronically.

## 2023-11-18 NOTE — Op Note (Signed)
 [facility] Patient Name: Mike Hall Procedure Date: 11/18/2023 8:52 AM MRN: [id number] Endoscopist: Eugenia Hess , MD, [id number] Age: 73 Referring MD:  Date of Birth: 07/28/1950 Gender: Male Account #: [id number] Procedure:                Colonoscopy Indications:              High risk colon cancer surveillance: Personal                            history of multiple (3 or more) adenomas, Last                            colonoscopy: May 2022 Medicines:                Monitored Anesthesia Care Procedure:                Pre-Anesthesia Assessment:                           - Prior to the procedure, a History and Physical                            was performed, and patient medications and                            allergies were reviewed. The patient's tolerance of                            previous anesthesia was also reviewed. The risks                            and benefits of the procedure and the sedation                            options and risks were discussed with the patient.                            All questions were answered, and informed consent                            was obtained. Prior Anticoagulants: The patient has                            taken no anticoagulant or antiplatelet agents. ASA                            Grade Assessment: II - A patient with mild systemic                            disease. After reviewing the risks and benefits,                            the patient was deemed in satisfactory condition to  undergo the procedure.                           After obtaining informed consent, the colonoscope                            was passed under direct vision. Throughout the                            procedure, the patient's blood pressure, pulse, and                            oxygen saturations were monitored continuously. The                            CF [id number] #[id number] was introduced through the  anus                            and advanced to the cecum, identified by                            appendiceal orifice and ileocecal valve. The                            colonoscopy was performed without difficulty. The                            patient tolerated the procedure well. The quality                            of the bowel preparation was good. The ileocecal                            valve, appendiceal orifice, and rectum were                            photographed. Scope In: 9:22:02 AM Scope Out: 9:37:08 AM Scope Withdrawal Time: 0 hours 12 minutes 24 seconds  Total Procedure Duration: 0 hours 15 minutes 6 seconds  Findings:                 Hemorrhoids were found on perianal exam.                           The digital rectal exam was normal. Pertinent                            negatives include normal sphincter tone and no                            palpable rectal lesions.                           The colon (entire examined portion) appeared normal.  Internal hemorrhoids were found during retroflexion. Complications:            No immediate complications. Estimated blood loss:                            None. Estimated Blood Loss:     Estimated blood loss: none. Impression:               - Hemorrhoids found on perianal exam.                           - The entire examined colon is normal.                           - Internal hemorrhoids.                           - No specimens collected. Recommendation:           - Discharge patient to home (ambulatory).                           - Repeat colonoscopy in 5 years for surveillance.                           - The findings and recommendations were discussed                            with the patient's family.                           - Return to referring physician.                           - Patient has a contact number available for                            emergencies. The signs and  symptoms of potential                            delayed complications were discussed with the                            patient. Return to normal activities tomorrow.                            Written discharge instructions were provided to the                            patient. Eugenia Hess, MD 11/18/2023 9:44:34 AM This report has been signed electronically.

## 2023-11-18 NOTE — Progress Notes (Signed)
 rev

## 2023-11-19 ENCOUNTER — Telehealth: Payer: Self-pay

## 2023-11-19 NOTE — Telephone Encounter (Signed)
  Follow up Call-     11/18/2023    7:52 AM  Call back number  Post procedure Call Back phone  # [phone]  Permission to leave phone message Yes    Follow up call, no answer.

## 2023-11-23 ENCOUNTER — Ambulatory Visit: Payer: Self-pay | Admitting: Pediatrics

## 2023-11-23 LAB — SURGICAL PATHOLOGY

## 2023-11-30 ENCOUNTER — Ambulatory Visit (INDEPENDENT_AMBULATORY_CARE_PROVIDER_SITE_OTHER): Payer: Medicare HMO

## 2023-11-30 ENCOUNTER — Encounter: Payer: Self-pay | Admitting: Internal Medicine

## 2023-11-30 ENCOUNTER — Telehealth: Payer: Self-pay | Admitting: Nurse Practitioner

## 2023-11-30 VITALS — Ht 70.0 in | Wt 179.0 lb

## 2023-11-30 DIAGNOSIS — Z0000 Encounter for general adult medical examination without abnormal findings: Secondary | ICD-10-CM | POA: Diagnosis not present

## 2023-11-30 NOTE — Telephone Encounter (Signed)
 Error

## 2023-11-30 NOTE — Patient Instructions (Signed)
 [name] , Thank you for taking time out of your busy schedule to complete your Annual Wellness Visit with me. I enjoyed our conversation and look forward to speaking with you again next year. I, as well as your care team,  appreciate your ongoing commitment to your health goals. Please review the following plan we discussed and let me know if I can assist you in the future. Your Game plan/ To Do List    Referrals: If you haven't heard from the office you've been referred to, please reach out to them at the phone provided.   Follow up Visits: Next Medicare AWV with our clinical staff: 12/05/24   Have you seen your provider in the last 6 months (3 months if uncontrolled diabetes)? Yes Next Office Visit with your provider: 08/25/24  Clinician Recommendations:  Aim for 30 minutes of exercise or brisk walking, 6-8 glasses of water, and 5 servings of fruits and vegetables each day.       This is a list of the screening recommended for you and due dates:  Health Maintenance  Topic Date Due   COVID-19 Vaccine (3 - Moderna risk series) 06/06/2020   Medicare Annual Wellness Visit  11/17/2023   Flu Shot  01/08/2024   Colon Cancer Screening  11/17/2028   DTaP/Tdap/Td vaccine (2 - Td or Tdap) 01/20/2032   Pneumococcal Vaccine for age over 75  Completed   Hepatitis C Screening  Completed   Zoster (Shingles) Vaccine  Completed   HPV Vaccine  Aged Out   Meningitis B Vaccine  Aged Out    Advanced directives: (Declined) Advance directive discussed with you today. Even though you declined this today, please call our office should you change your mind, and we can give you the proper paperwork for you to fill out. Advance Care Planning is important because it:  [x]  Makes sure you receive the medical care that is consistent with your values, goals, and preferences  [x]  It provides guidance to your family and loved ones and reduces their decisional burden about whether or not they are making the right  decisions based on your wishes.  Follow the link provided in your after visit summary or read over the paperwork we have mailed to you to help you started getting your Advance Directives in place. If you need assistance in completing these, please reach out to us  so that we can help you!  See attachments for Preventive Care and Fall Prevention Tips.

## 2023-11-30 NOTE — Telephone Encounter (Signed)
 Pt called in asking if they have Lpa ordered for next appt 03/23/24 with Swinyer, NP. Please advise.

## 2023-11-30 NOTE — Telephone Encounter (Signed)
 We do not need to continue to check LP(a). I can explain the reason for this at his upcoming appointment.

## 2023-11-30 NOTE — Progress Notes (Signed)
 Subjective:   Mike Hall is a 73 y.o. who presents for a Medicare Wellness preventive visit.  As a reminder, Annual Wellness Visits don't include a physical exam, and some assessments may be limited, especially if this visit is performed virtually. We may recommend an in-person follow-up visit with your provider if needed.  Visit Complete: Virtual I connected with  Mike Hall on 11/30/23 by a audio enabled telemedicine application and verified that I am speaking with the correct person using two identifiers.  Patient Location: Home  Provider Location: Office/Clinic  I discussed the limitations of evaluation and management by telemedicine. The patient expressed understanding and agreed to proceed.  Vital Signs: Because this visit was a virtual/telehealth visit, some criteria may be missing or patient reported. Any vitals not documented were not able to be obtained and vitals that have been documented are patient reported.  VideoDeclined- This patient declined Librarian, academic. Therefore the visit was completed with audio only.  Persons Participating in Visit: Patient.  AWV Questionnaire: Yes: Patient Medicare AWV questionnaire was completed by the patient on 11/27/23; I have confirmed that all information answered by patient is correct and no changes since this date.  Cardiac Risk Factors include: advanced age (>87men, >77 women);male gender;dyslipidemia     Objective:    Today's Vitals   11/30/23 0932  Weight: 179 lb (81.2 kg)  Height: 5' 10 (1.778 m)   Body mass index is 25.68 kg/m.     11/30/2023    9:35 AM 11/17/2022    9:44 AM 11/14/2021    9:48 AM 11/08/2020    9:41 AM 12/07/2018    5:39 AM 12/06/2018   11:12 PM 04/04/2015    3:50 PM  Advanced Directives  Does Patient Have a Medical Advance Directive? No No No No  No Yes   Type of Tax inspector;Living will   Does patient want to make changes to  medical advance directive?       No - Patient declined   Copy of Healthcare Power of Attorney in Chart?       No - copy requested   Would patient like information on creating a medical advance directive? No - Patient declined No - Patient declined No - Patient declined No - Patient declined No - Patient declined        Data saved with a previous flowsheet row definition    Current Medications (verified) Outpatient Encounter Medications as of 11/30/2023  Medication Sig   aspirin EC 81 MG tablet Take 81 mg by mouth daily. Swallow whole.   Cholecalciferol 50 MCG (2000 UT) CAPS 1 capsule every day by oral route.   DENTA 5000 PLUS 1.1 % CREA dental cream Take by mouth.   Evolocumab  (REPATHA  SURECLICK) 140 MG/ML SOAJ INJECT 140 MG INTO THE SKIN EVERY 14 (FOURTEEN) DAYS.   Magnesium Gluconate (MAGONATE PO) Take 800 mg by mouth.   rosuvastatin  (CRESTOR ) 5 MG tablet TAKE 1 TABLET BY MOUTH EVERY OTHER DAY   pantoprazole  (PROTONIX ) 40 MG tablet Take 1 tablet (40 mg total) by mouth daily. (Patient not taking: Reported on 11/30/2023)   No facility-administered encounter medications on file as of 11/30/2023.    Allergies (verified) Patient has no known allergies.   History: Past Medical History:  Diagnosis Date   Allergy 2015   Hyperlipidemia    Pre-syncope    Past Surgical History:  Procedure Laterality Date  COLONOSCOPY  04/18/2015   Stark-polyp   none     POLYPECTOMY     Family History  Problem Relation Age of Onset   Uterine cancer Mother        22   Cancer Mother    Coronary artery disease Father        age 27   Arthritis Father    Heart disease Father    Diabetes Mellitus I Daughter    Hypertension Brother    Cancer Brother    Cancer Sister    Diabetes Daughter    Colon cancer Neg Hx    Colon polyps Neg Hx    Esophageal cancer Neg Hx    Rectal cancer Neg Hx    Stomach cancer Neg Hx    Social History   Socioeconomic History   Marital status: Married    Spouse  name: Not on file   Number of children: Not on file   Years of education: Not on file   Highest education level: GED or equivalent  Occupational History   Not on file  Tobacco Use   Smoking status: Never   Smokeless tobacco: Never  Vaping Use   Vaping status: Never Used  Substance and Sexual Activity   Alcohol use: No   Drug use: No   Sexual activity: Yes    Birth control/protection: None  Other Topics Concern   Not on file  Social History Narrative   Married. 1 daughter. No grandkids.    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       Hobbies: time at home, yardwork   Social Drivers of Health   Financial Resource Strain: Low Risk  (11/27/2023)   Overall Financial Resource Strain (CARDIA)    Difficulty of Paying Living Expenses: Not hard at all  Food Insecurity: No Food Insecurity (11/27/2023)   Hunger Vital Sign    Worried About Running Out of Food in the Last Year: Never true    Ran Out of Food in the Last Year: Never true  Transportation Needs: No Transportation Needs (11/27/2023)   PRAPARE - Administrator, Civil Service (Medical): No    Lack of Transportation (Non-Medical): No  Physical Activity: Sufficiently Active (11/27/2023)   Exercise Vital Sign    Days of Exercise per Week: 5 days    Minutes of Exercise per Session: 40 min  Stress: No Stress Concern Present (11/27/2023)   [employer] of Occupational Health - Occupational Stress Questionnaire    Feeling of Stress: Not at all  Social Connections: Moderately Integrated (11/27/2023)   Social Connection and Isolation Panel    Frequency of Communication with Friends and Family: More than three times a week    Frequency of Social Gatherings with Friends and Family: More than three times a week    Attends Religious Services: More than 4 times per year    Active Member of [employer] or Organizations: No    Attends Engineer, structural: Not on file     Marital Status: Married    Tobacco Counseling Counseling given: Not Answered    Clinical Intake:  Pre-visit preparation completed: Yes  Pain : No/denies pain     BMI - recorded: 25.68 Nutritional Status: BMI 25 -29 Overweight Nutritional Risks: None Diabetes: No  Lab Results  Component Value Date   HGBA1C 5.7 08/21/2023     How often do you need to have someone help you when  you read instructions, pamphlets, or other written materials from your doctor or pharmacy?: 1 - Never  Interpreter Needed?: No  Information entered by :: Ellouise Haws, LPN   Activities of Daily Living     11/30/2023    9:34 AM  In your present state of health, do you have any difficulty performing the following activities:  Hearing? 0  Vision? 0  Difficulty concentrating or making decisions? 0  Walking or climbing stairs? 0  Dressing or bathing? 0  Doing errands, shopping? 0  Preparing Food and eating ? N  Using the Toilet? N  In the past six months, have you accidently leaked urine? N  Do you have problems with loss of bowel control? N  Managing your Medications? N  Managing your Finances? N  Housekeeping or managing your Housekeeping? N    Patient Care Team: Katrinka Garnette KIDD, MD as PCP - General (Family Medicine) Robinson Pao, MD as Consulting Physician (Dermatology) Aneita Gwendlyn DASEN, MD (Inactive) as Consulting Physician (Gastroenterology)  I have updated your Care Teams any recent Medical Services you may have received from other providers in the past year.     Assessment:   This is a routine wellness examination for Zacharey.  Hearing/Vision screen Hearing Screening - Comments:: Pt denies any hearing issues  Vision Screening - Comments:: Wears rx glasses - up to date with routine eye exams with Dr joshua with [facility]     Goals Addressed             This Visit's Progress    Patient Stated       Maintain health and activity       Depression Screen      11/30/2023    9:35 AM 11/17/2022    9:43 AM 11/14/2021    9:47 AM 11/13/2021    8:10 AM 11/08/2020    9:40 AM 11/06/2020    8:10 AM 11/03/2019    8:29 AM  PHQ 2/9 Scores  PHQ - 2 Score 0 0 0 0 0 0 0  PHQ- 9 Score    0  0     Fall Risk     11/30/2023    9:37 AM 11/14/2022   12:08 PM 09/03/2022   12:50 PM 11/14/2021    9:48 AM 11/13/2021    8:10 AM  Fall Risk   Falls in the past year? 0 0 0 0 0  Number falls in past yr: 0 0 0 0 0  Injury with Fall? 0 0 0 0 0  Risk for fall due to : No Fall Risks Impaired vision No Fall Risks Impaired vision No Fall Risks  Follow up Falls prevention discussed Falls prevention discussed Falls evaluation completed Falls prevention discussed  Falls evaluation completed      Data saved with a previous flowsheet row definition    MEDICARE RISK AT HOME:  Medicare Risk at Home Any stairs in or around the home?: Yes If so, are there any without handrails?: No Home free of loose throw rugs in walkways, pet beds, electrical cords, etc?: Yes Life alert?: No Use of a cane, walker or w/c?: No Grab bars in the bathroom?: No Shower chair or bench in shower?: Yes Elevated toilet seat or a handicapped toilet?: No  TIMED UP AND GO:  Was the test performed?  No  Cognitive Function: 6CIT completed        11/30/2023    9:37 AM 11/17/2022    9:45 AM 11/14/2021  9:49 AM 11/08/2020    9:43 AM  6CIT Screen  What Year? 0 points 0 points 0 points 0 points  What month? 0 points 0 points 0 points 0 points  What time? 0 points 0 points 0 points 0 points  Count back from 20 0 points 0 points 0 points 0 points  Months in reverse 0 points 0 points 0 points 0 points  Repeat phrase 0 points 0 points 0 points 0 points  Total Score 0 points 0 points 0 points 0 points    Immunizations Immunization History  Administered Date(s) Administered   Fluad Quad(high Dose 65+) 03/16/2022   Influenza Split 03/03/2013   Influenza, High Dose Seasonal PF 03/19/2018   Influenza,inj,Quad  PF,6+ Mos 02/28/2014, 03/08/2015, 02/26/2016, 03/17/2017   Influenza-Unspecified 03/19/2018, 03/01/2019, 04/01/2021   Moderna SARS-COV2 Booster Vaccination 05/09/2020   Moderna Sars-Covid-2 Vaccination 07/13/2019, 08/11/2019   PNEUMOCOCCAL CONJUGATE-20 11/06/2020   Tdap 01/19/2022   Zoster Recombinant(Shingrix) 01/19/2022, 05/06/2022   Zoster, Live 05/16/2013    Screening Tests Health Maintenance  Topic Date Due   COVID-19 Vaccine (3 - Moderna risk series) 06/06/2020   INFLUENZA VACCINE  01/08/2024   Medicare Annual Wellness (AWV)  11/29/2024   Colonoscopy  11/17/2028   DTaP/Tdap/Td (2 - Td or Tdap) 01/20/2032   Pneumococcal Vaccine: 50+ Years  Completed   Hepatitis C Screening  Completed   Zoster Vaccines- Shingrix  Completed   HPV VACCINES  Aged Out   Meningococcal B Vaccine  Aged Out    Health Maintenance  Health Maintenance Due  Topic Date Due   COVID-19 Vaccine (3 - Moderna risk series) 06/06/2020   Health Maintenance Items Addressed: See Nurse Notes at the end of this note  Additional Screening:  Vision Screening: Recommended annual ophthalmology exams for early detection of glaucoma and other disorders of the eye. Would you like a referral to an eye doctor? No    Dental Screening: Recommended annual dental exams for proper oral hygiene  Community Resource Referral / Chronic Care Management: CRR required this visit?  No   CCM required this visit?  No   Plan:    I have personally reviewed and noted the following in the patient's chart:   Medical and social history Use of alcohol, tobacco or illicit drugs  Current medications and supplements including opioid prescriptions. Patient is not currently taking opioid prescriptions. Functional ability and status Nutritional status Physical activity Advanced directives List of other physicians Hospitalizations, surgeries, and ER visits in previous 12 months Vitals Screenings to include cognitive, depression,  and falls Referrals and appointments  In addition, I have reviewed and discussed with patient certain preventive protocols, quality metrics, and best practice recommendations. A written personalized care plan for preventive services as well as general preventive health recommendations were provided to patient.   Ellouise VEAR Haws, LPN   3/76/7974   After Visit Summary: (MyChart) Due to this being a telephonic visit, the after visit summary with patients personalized plan was offered to patient via MyChart   Notes: Nothing significant to report at this time.

## 2023-11-30 NOTE — Telephone Encounter (Signed)
 Last Lpa drawn 04/2023- does he need another?

## 2023-12-08 ENCOUNTER — Encounter: Payer: Self-pay | Admitting: Pediatrics

## 2024-01-02 ENCOUNTER — Other Ambulatory Visit: Payer: Self-pay | Admitting: Physician Assistant

## 2024-01-09 ENCOUNTER — Other Ambulatory Visit: Payer: Self-pay | Admitting: Internal Medicine

## 2024-01-09 DIAGNOSIS — E7841 Elevated Lipoprotein(a): Secondary | ICD-10-CM

## 2024-01-09 DIAGNOSIS — E785 Hyperlipidemia, unspecified: Secondary | ICD-10-CM

## 2024-01-09 DIAGNOSIS — R931 Abnormal findings on diagnostic imaging of heart and coronary circulation: Secondary | ICD-10-CM

## 2024-01-21 ENCOUNTER — Other Ambulatory Visit: Payer: Self-pay

## 2024-01-21 DIAGNOSIS — E785 Hyperlipidemia, unspecified: Secondary | ICD-10-CM

## 2024-03-06 ENCOUNTER — Other Ambulatory Visit: Payer: Self-pay | Admitting: Internal Medicine

## 2024-03-16 LAB — NMR, LIPOPROFILE
Cholesterol, Total: 112 mg/dL (ref 100–199)
HDL Particle Number: 34.5 umol/L (ref >=30.5)
HDL-C: 46 mg/dL (ref >39)
LDL Particle Number: 582 nmol/L (ref <1000)
LDL Size: 20.8 nm (ref >20.5)
LDL-C (NIH Calc): 50 mg/dL (ref 0–99)
LP-IR Score: 59 — ABNORMAL HIGH (ref <=45)
Small LDL Particle Number: 263 nmol/L (ref <=527)
Triglycerides: 77 mg/dL (ref 0–149)

## 2024-03-17 ENCOUNTER — Ambulatory Visit: Payer: Self-pay | Admitting: Internal Medicine

## 2024-03-23 ENCOUNTER — Ambulatory Visit (HOSPITAL_BASED_OUTPATIENT_CLINIC_OR_DEPARTMENT_OTHER): Admitting: Nurse Practitioner

## 2024-03-23 ENCOUNTER — Encounter (HOSPITAL_BASED_OUTPATIENT_CLINIC_OR_DEPARTMENT_OTHER): Payer: Self-pay | Admitting: Nurse Practitioner

## 2024-03-23 VITALS — BP 114/78 | HR 59 | Ht 70.0 in | Wt 182.5 lb

## 2024-03-23 DIAGNOSIS — E785 Hyperlipidemia, unspecified: Secondary | ICD-10-CM

## 2024-03-23 DIAGNOSIS — I2510 Atherosclerotic heart disease of native coronary artery without angina pectoris: Secondary | ICD-10-CM

## 2024-03-23 DIAGNOSIS — E88819 Insulin resistance, unspecified: Secondary | ICD-10-CM | POA: Diagnosis not present

## 2024-03-23 DIAGNOSIS — E7841 Elevated Lipoprotein(a): Secondary | ICD-10-CM | POA: Diagnosis not present

## 2024-03-23 DIAGNOSIS — R931 Abnormal findings on diagnostic imaging of heart and coronary circulation: Secondary | ICD-10-CM | POA: Diagnosis not present

## 2024-03-23 NOTE — Progress Notes (Signed)
 Cardiology Office Note   Date:  03/23/2024  ID:  Mike Hall, DOB 12/26/1950, MRN [id number] PCP: Katrinka Garnette KIDD, MD  [facility] Providers Cardiologist:  None     PMH Dyslipidemia Family history of heart disease Coronary artery disease CT calcium  score 11/17/2019 CAC score 752 (87th percentile) Distribution in LAD and LCx Low risk nuclear stress test 02/2020 Elevated lipoprotein a  Referred to Advanced Lipid disorders clinic and seen by Dr. Mona 02/10/2020.  He reported dyspnea and possibly progressive fatigue over the previous 6 months.  He felt it may have been worse when he realized he had coronary artery disease.  Family history of heart disease in his father who had MI at age 39.  Lipid profile at that time revealed total cholesterol 157, triglycerides 83, HDL 48, and LDL 93.  He had myalgias on atorvastatin .  He had recently been switched to rosuvastatin  40 mg daily.  He had improvement in LDL to 85 on statin.  He additionally had elevated lipoprotein a of 229.  Was started on Praluent  and continued on low dose rosuvastatin  with LDL down to 44, particle number 504 and LP(a) down to 147 on labs completed 03/2023.  Lipid panel 03/15/2024 with LDL particle #582, LDL-C 50, HDL-C 46, triglycerides 77, total cholesterol 112, small LDL-P 263.   History of Present Illness Discussed the use of AI scribe software for clinical note transcription with the patient, who gave verbal consent to proceed.  History of Present Illness Mike Hall is a very pleasant 73 year old male who presents for follow-up of CAD and dyslipidemia. He is accompanied by his wife. He asks about insulin resistance, noted as being elevated on recent lipid panel. A1C was 5.7% on 08/21/2023. We discussed implications of inflammation and sugar intake. He maintains physical activity by walking two and a half miles 3-4 times a week without breathlessness unless on an incline. His pace is fast, completing the  distance in about forty minutes.  He denies chest pain, orthopnea, PND, edema, palpitations, presyncope, syncope.  Reports diet is generally healthy often limiting portions and eating healthy foods but he does admit to a fondness for sweets. He asks about further evaluation of coronary artery disease.  He is not having any concerning side effects on rosuvastatin  and Repatha  and no problems with obtaining medications through insurance.   ROS: See HPI  Studies Reviewed      Lipoprotein (a)  Date/Time Value Ref Range Status  03/16/2023 08:24 AM 147.9 (H) <75.0 nmol/L Final    Comment:    Note:  Values greater than or equal to 75.0 nmol/L may        indicate an independent risk factor for CHD,        but must be evaluated with caution when applied        to non-Caucasian populations due to the        influence of genetic factors on Lp(a) across        ethnicities.     Risk Assessment/Calculations           Physical Exam VS:  BP 114/78   Pulse (!) 59   Ht 5' 10 (1.778 m)   Wt 182 lb 8 oz (82.8 kg)   SpO2 98%   BMI 26.19 kg/m    Wt Readings from Last 3 Encounters:  03/23/24 182 lb 8 oz (82.8 kg)  11/30/23 179 lb (81.2 kg)  11/18/23 179 lb (81.2 kg)  GEN: Well nourished, well developed in no acute distress NECK: No JVD; No carotid bruits CARDIAC: RRR, no murmurs, rubs, gallops RESPIRATORY:  Clear to auscultation without rales, wheezing or rhonchi  ABDOMEN: Soft, non-tender, non-distended EXTREMITIES:  No edema; No deformity   Assessment & Plan Coronary artery disease  Cardiac risk CT calcium  score in 2021 with CAC of 752 (87th percentile) with distribution in LAD and LCx.  He had low risk stress test 02/2020.  He remains active and denies chest pain, dyspnea, or other symptoms concerning for angina.  No indication for further ischemic evaluation at this time. Pt and wife state they want to be proactive and be aware of stenosis before it causes an issue.  We discussed the  potential of coronary CTA in the future.  Patient would like to hold off for now.  Advised if he develops symptoms to notify us .  Lipids are well-controlled. BP well controlled.  - Continue aspirin -Continue lipid lowering therapy as noted below - Continue heart healthy diet avoiding processed foods, saturated fat, sugar, and other simple carbohydrates (we discussed this in detail) - Continue regular physical activity aiming for at least 150 minutes of moderate intensity exercise each week - Recommendation to add weight lifting/resistance training to regular exercise routine  Hyperlipidemia LDL goal < 55 Elevated lipoprotein a LP(a) elevated at 147.9 nmol/L. Lipid panel completed 03/15/2024 with LDL particle #582, LDL-C 58, HDL-C 46, triglycerides 77, total cholesterol 112, and small LDL-P 263. He notes slight increases from last year. Feels that diet and exercise are consistent. We discussed potential modifications to diet and exercise that may be beneficial.  - Continue heart healthy diet limiting sugar to under 30 g daily  - Continue regular physical activity  - Consider alternative type of exercise for potential benefit  - Recommendation to incorporate weight lifting and resistance training in addition to regular cardiovascular exercise  - Continue Repatha  140 mg every 14 days  - Continue rosuvastatin  5 mg daily   Insulin resistance   He is concerned about insulin resistance present with a score of 59, was 50 one year ago. A1C was 5.7% on 08/21/23.  Discussed the role of inflammation and sugar intake. Emphasized lifestyle management with continued regular exercise, along with healthy diet  - Recommend healthy diet with limiting sugar intake to under 30 grams per day -Incorporate weightlifting for additional metabolism benefit -Continue regular physical activity        Dispo: 1 year with Dr. Mona or me  Signed, Rosaline Bane, NP-C

## 2024-03-23 NOTE — Patient Instructions (Signed)
 Medication Instructions:   Your physician recommends that you continue on your current medications as directed. Please refer to the Current Medication list given to you today.   *If you need a refill on your cardiac medications before your next appointment, please call your pharmacy*  Lab Work:  None ordered.  If you have labs (blood work) drawn today and your tests are completely normal, you will receive your results only by: MyChart Message (if you have MyChart) OR A paper copy in the mail If you have any lab test that is abnormal or we need to change your treatment, we will call you to review the results.  Testing/Procedures:  None ordered.   Follow-Up: At [facility], you and your health needs are our priority.  As part of our continuing mission to provide you with exceptional heart care, our providers are all part of one team.  This team includes your primary Cardiologist (physician) and Advanced Practice Providers or APPs (Physician Assistants and Nurse Practitioners) who all work together to provide you with the care you need, when you need it.  Your next appointment:   1 year(s)  Provider:   K. Italy Hilty, MD or Eligha Bridegroom, NP    We recommend signing up for the patient portal called "MyChart".  Sign up information is provided on this After Visit Summary.  MyChart is used to connect with patients for Virtual Visits (Telemedicine).  Patients are able to view lab/test results, encounter notes, upcoming appointments, etc.  Non-urgent messages can be sent to your provider as well.   To learn more about what you can do with MyChart, go to [email or web address].   Other Instructions  Your physician wants you to follow-up in: 1 year.  You will receive a reminder letter in the mail two months in advance. If you don't receive a letter, please call our office to schedule the follow-up appointment.

## 2024-06-04 ENCOUNTER — Other Ambulatory Visit: Payer: Self-pay | Admitting: Internal Medicine

## 2024-07-05 ENCOUNTER — Encounter (HOSPITAL_BASED_OUTPATIENT_CLINIC_OR_DEPARTMENT_OTHER): Payer: Self-pay

## 2024-07-05 NOTE — Telephone Encounter (Signed)
 Prior auth for Repatha  please. TY!  Theo Krumholz S Renly Guedes, NP

## 2024-07-07 ENCOUNTER — Other Ambulatory Visit (HOSPITAL_COMMUNITY): Payer: Self-pay

## 2024-07-07 ENCOUNTER — Telehealth: Payer: Self-pay | Admitting: Pharmacy Technician

## 2024-07-07 NOTE — Telephone Encounter (Signed)
" ° °  Pharmacy Patient Advocate Encounter   Received notification from Patient Advice Request messages that prior authorization for repatha  is required/requested.   Insurance verification completed.   The patient is insured through [employer].   Per test claim: PA required; PA submitted to above mentioned insurance via Latent Key/confirmation #/[id number] [facility] Status is pending  "

## 2024-07-07 NOTE — Telephone Encounter (Signed)
 Pharmacy Patient Advocate Encounter  Received notification from [employer] that Prior Authorization for repatha  has been APPROVED from 06/09/24 to 06/08/25   PA #/Case ID/Reference #: [id number]

## 2024-08-25 ENCOUNTER — Encounter: Admitting: Family Medicine

## 2024-11-11 ENCOUNTER — Encounter: Admitting: Family Medicine

## 2024-12-05 ENCOUNTER — Ambulatory Visit
